# Patient Record
Sex: Female | Born: 1980 | Race: White | Hispanic: No | Marital: Married | Smoking: Former smoker
Health system: Southern US, Community
[De-identification: ages and names within clinical notes are randomized; demographics above are authoritative.]

## PROBLEM LIST (undated history)

## (undated) DIAGNOSIS — IMO0002 Reserved for concepts with insufficient information to code with codable children: Secondary | ICD-10-CM

## (undated) DIAGNOSIS — M329 Systemic lupus erythematosus, unspecified: Secondary | ICD-10-CM

## (undated) HISTORY — PX: CHOLECYSTECTOMY: SHX55

## (undated) HISTORY — PX: OOPHORECTOMY: SHX86

## (undated) HISTORY — PX: ABDOMINAL HYSTERECTOMY: SHX81

---
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
## 1998-01-16 ENCOUNTER — Encounter: Admission: RE | Admit: 1998-01-16 | Discharge: 1998-01-16 | Payer: Self-pay | Admitting: Family Medicine

## 2000-02-14 ENCOUNTER — Encounter (INDEPENDENT_AMBULATORY_CARE_PROVIDER_SITE_OTHER): Payer: Self-pay | Admitting: Specialist

## 2000-02-14 ENCOUNTER — Other Ambulatory Visit: Admission: RE | Admit: 2000-02-14 | Discharge: 2000-02-14 | Payer: Self-pay | Admitting: *Deleted

## 2000-03-31 ENCOUNTER — Emergency Department (HOSPITAL_COMMUNITY): Admission: EM | Admit: 2000-03-31 | Discharge: 2000-03-31 | Payer: Self-pay | Admitting: Emergency Medicine

## 2000-08-20 ENCOUNTER — Other Ambulatory Visit: Admission: RE | Admit: 2000-08-20 | Discharge: 2000-08-20 | Payer: Self-pay | Admitting: *Deleted

## 2000-09-14 ENCOUNTER — Other Ambulatory Visit: Admission: RE | Admit: 2000-09-14 | Discharge: 2000-09-14 | Payer: Self-pay | Admitting: *Deleted

## 2000-09-14 ENCOUNTER — Encounter (INDEPENDENT_AMBULATORY_CARE_PROVIDER_SITE_OTHER): Payer: Self-pay | Admitting: *Deleted

## 2001-07-26 ENCOUNTER — Ambulatory Visit (HOSPITAL_COMMUNITY): Admission: RE | Admit: 2001-07-26 | Discharge: 2001-07-26 | Payer: Self-pay | Admitting: *Deleted

## 2007-09-17 ENCOUNTER — Inpatient Hospital Stay (HOSPITAL_COMMUNITY): Admission: AD | Admit: 2007-09-17 | Discharge: 2007-09-17 | Payer: Self-pay | Admitting: Obstetrics & Gynecology

## 2007-09-21 ENCOUNTER — Emergency Department (HOSPITAL_COMMUNITY): Admission: EM | Admit: 2007-09-21 | Discharge: 2007-09-21 | Payer: Self-pay | Admitting: Emergency Medicine

## 2007-09-28 ENCOUNTER — Emergency Department (HOSPITAL_COMMUNITY): Admission: EM | Admit: 2007-09-28 | Discharge: 2007-09-28 | Payer: Self-pay | Admitting: Emergency Medicine

## 2007-10-17 ENCOUNTER — Emergency Department (HOSPITAL_COMMUNITY): Admission: EM | Admit: 2007-10-17 | Discharge: 2007-10-18 | Payer: Self-pay | Admitting: Emergency Medicine

## 2008-01-16 ENCOUNTER — Emergency Department (HOSPITAL_COMMUNITY): Admission: EM | Admit: 2008-01-16 | Discharge: 2008-01-16 | Payer: Self-pay | Admitting: Emergency Medicine

## 2008-01-19 ENCOUNTER — Emergency Department: Payer: Self-pay | Admitting: Emergency Medicine

## 2008-01-28 ENCOUNTER — Inpatient Hospital Stay (HOSPITAL_COMMUNITY): Admission: AD | Admit: 2008-01-28 | Discharge: 2008-01-29 | Payer: Self-pay | Admitting: Obstetrics and Gynecology

## 2008-03-06 ENCOUNTER — Inpatient Hospital Stay (HOSPITAL_COMMUNITY): Admission: AD | Admit: 2008-03-06 | Discharge: 2008-03-06 | Payer: Self-pay | Admitting: Obstetrics & Gynecology

## 2008-03-09 ENCOUNTER — Emergency Department (HOSPITAL_COMMUNITY): Admission: EM | Admit: 2008-03-09 | Discharge: 2008-03-09 | Payer: Self-pay | Admitting: Family Medicine

## 2008-05-16 ENCOUNTER — Emergency Department (HOSPITAL_COMMUNITY): Admission: EM | Admit: 2008-05-16 | Discharge: 2008-05-16 | Payer: Self-pay | Admitting: Family Medicine

## 2008-11-30 ENCOUNTER — Emergency Department (HOSPITAL_COMMUNITY): Admission: EM | Admit: 2008-11-30 | Discharge: 2008-12-01 | Payer: Self-pay | Admitting: Emergency Medicine

## 2009-02-18 ENCOUNTER — Emergency Department (HOSPITAL_COMMUNITY): Admission: EM | Admit: 2009-02-18 | Discharge: 2009-02-19 | Payer: Self-pay | Admitting: Emergency Medicine

## 2009-02-18 ENCOUNTER — Encounter (INDEPENDENT_AMBULATORY_CARE_PROVIDER_SITE_OTHER): Payer: Self-pay | Admitting: *Deleted

## 2009-03-07 ENCOUNTER — Ambulatory Visit: Payer: Self-pay | Admitting: Obstetrics and Gynecology

## 2009-03-07 ENCOUNTER — Encounter: Payer: Self-pay | Admitting: Obstetrics & Gynecology

## 2009-04-02 ENCOUNTER — Ambulatory Visit: Payer: Self-pay | Admitting: Obstetrics & Gynecology

## 2009-04-02 ENCOUNTER — Encounter: Payer: Self-pay | Admitting: Obstetrics & Gynecology

## 2009-04-02 ENCOUNTER — Inpatient Hospital Stay (HOSPITAL_COMMUNITY): Admission: RE | Admit: 2009-04-02 | Discharge: 2009-04-04 | Payer: Self-pay | Admitting: Obstetrics & Gynecology

## 2009-04-12 ENCOUNTER — Inpatient Hospital Stay (HOSPITAL_COMMUNITY): Admission: AD | Admit: 2009-04-12 | Discharge: 2009-04-12 | Payer: Self-pay | Admitting: Obstetrics and Gynecology

## 2009-04-15 ENCOUNTER — Inpatient Hospital Stay (HOSPITAL_COMMUNITY): Admission: AD | Admit: 2009-04-15 | Discharge: 2009-04-16 | Payer: Self-pay | Admitting: Obstetrics & Gynecology

## 2009-04-16 ENCOUNTER — Encounter (INDEPENDENT_AMBULATORY_CARE_PROVIDER_SITE_OTHER): Payer: Self-pay | Admitting: *Deleted

## 2009-05-01 ENCOUNTER — Encounter (INDEPENDENT_AMBULATORY_CARE_PROVIDER_SITE_OTHER): Payer: Self-pay | Admitting: *Deleted

## 2010-01-05 ENCOUNTER — Emergency Department (HOSPITAL_COMMUNITY): Admission: EM | Admit: 2010-01-05 | Discharge: 2010-01-05 | Payer: Self-pay | Admitting: Emergency Medicine

## 2010-04-12 DIAGNOSIS — F1111 Opioid abuse, in remission: Secondary | ICD-10-CM | POA: Insufficient documentation

## 2010-05-30 NOTE — Letter (Signed)
Summary: New Patient letter  [facility]  [address] [place], [place] [address]   Phone: [phone]  Fax: [phone]       05/01/2009 MRN: [id number]  [facility] [address] [place], [place]  [address]  Dear Ms. Lawana Pai,  Welcome to the Gastroenterology Division at [facility].    You are scheduled to see Dr.  Christella Hartigan on 05-22-09 at 3:30pm on the 3rd floor at [facility], [address]  We ask that you try to arrive at our office 15 minutes prior to your appointment time to allow for check-in.  We would like you to complete the enclosed self-administered evaluation form prior to your visit and bring it with you on the day of your appointment.  We will review it with you.  Also, please bring a complete list of all your medications or, if you prefer, bring the medication bottles and we will list them.  Please bring your insurance card so that we may make a copy of it.  If your insurance requires a referral to see a specialist, please bring your referral form from your primary care physician.  Co-payments are due at the time of your visit and may be paid by cash, check or credit card.     Your office visit will consist of a consult with your physician (includes a physical exam), any laboratory testing he/she may order, scheduling of any necessary diagnostic testing (e.g. x-ray, ultrasound, CT-scan), and scheduling of a procedure (e.g. Endoscopy, Colonoscopy) if required.  Please allow enough time on your schedule to allow for any/all of these possibilities.    If you cannot keep your appointment, please call [phone] to cancel or reschedule prior to your appointment date.  This allows [place] the opportunity to schedule an appointment for another patient in need of care.  If you do not cancel or reschedule by 5 p.m. the business day prior to your appointment date, you will be charged a $50.00 late cancellation/no-show fee.    Thank you for choosing  [facility] for your medical needs.  We appreciate the opportunity to care for you.  Please visit [place] at our website  to learn more about our practice.                     Sincerely,                                                             The Gastroenterology Division

## 2010-07-29 LAB — HEMOCCULT GUIAC POC 1CARD (OFFICE): Fecal Occult Bld: NEGATIVE

## 2010-07-30 LAB — CBC
Hemoglobin: 12.8 g/dL (ref 12.0–15.0)
MCHC: 33.6 g/dL (ref 30.0–36.0)
MCHC: 33.9 g/dL (ref 30.0–36.0)
MCV: 90 fL (ref 78.0–100.0)
MCV: 90.3 fL (ref 78.0–100.0)
Platelets: 160 10*3/uL (ref 150–400)
Platelets: 187 10*3/uL (ref 150–400)
RBC: 4.24 MIL/uL (ref 3.87–5.11)
RBC: 4.71 MIL/uL (ref 3.87–5.11)
RDW: 13.4 % (ref 11.5–15.5)
WBC: 8.9 10*3/uL (ref 4.0–10.5)

## 2010-07-30 LAB — TYPE AND SCREEN
ABO/RH(D): O POS
Antibody Screen: NEGATIVE

## 2010-08-01 LAB — CBC
HCT: 42.5 % (ref 36.0–46.0)
MCV: 88.9 fL (ref 78.0–100.0)
Platelets: 165 10*3/uL (ref 150–400)
RDW: 13.2 % (ref 11.5–15.5)

## 2010-08-01 LAB — URINALYSIS, ROUTINE W REFLEX MICROSCOPIC
Ketones, ur: NEGATIVE mg/dL
Nitrite: NEGATIVE
Protein, ur: NEGATIVE mg/dL
Urobilinogen, UA: 0.2 mg/dL (ref 0.0–1.0)

## 2010-08-01 LAB — COMPREHENSIVE METABOLIC PANEL
Albumin: 3.8 g/dL (ref 3.5–5.2)
BUN: 8 mg/dL (ref 6–23)
Creatinine, Ser: 0.73 mg/dL (ref 0.4–1.2)
Total Bilirubin: 1.2 mg/dL (ref 0.3–1.2)
Total Protein: 6.7 g/dL (ref 6.0–8.3)

## 2010-08-01 LAB — GC/CHLAMYDIA PROBE AMP, GENITAL
Chlamydia, DNA Probe: NEGATIVE
GC Probe Amp, Genital: NEGATIVE

## 2010-08-01 LAB — DIFFERENTIAL
Basophils Absolute: 0 10*3/uL (ref 0.0–0.1)
Lymphocytes Relative: 23 % (ref 12–46)
Monocytes Absolute: 0.6 10*3/uL (ref 0.1–1.0)
Monocytes Relative: 8 % (ref 3–12)
Neutro Abs: 5.4 10*3/uL (ref 1.7–7.7)
Neutrophils Relative %: 69 % (ref 43–77)

## 2010-08-01 LAB — WET PREP, GENITAL
WBC, Wet Prep HPF POC: NONE SEEN
Yeast Wet Prep HPF POC: NONE SEEN

## 2010-08-03 LAB — POCT I-STAT, CHEM 8
Calcium, Ion: 1.21 mmol/L (ref 1.12–1.32)
HCT: 42 % (ref 36.0–46.0)
Hemoglobin: 14.3 g/dL (ref 12.0–15.0)
Sodium: 139 mEq/L (ref 135–145)
TCO2: 23 mmol/L (ref 0–100)

## 2010-08-03 LAB — POCT PREGNANCY, URINE: Preg Test, Ur: NEGATIVE

## 2010-08-03 LAB — DIFFERENTIAL
Basophils Absolute: 0 10*3/uL (ref 0.0–0.1)
Basophils Relative: 0 % (ref 0–1)
Eosinophils Absolute: 0.1 10*3/uL (ref 0.0–0.7)
Eosinophils Relative: 1 % (ref 0–5)
Lymphocytes Relative: 24 % (ref 12–46)
Monocytes Absolute: 0.8 10*3/uL (ref 0.1–1.0)

## 2010-08-03 LAB — CBC
HCT: 40.6 % (ref 36.0–46.0)
MCHC: 34 g/dL (ref 30.0–36.0)
MCV: 87.7 fL (ref 78.0–100.0)
Platelets: 199 10*3/uL (ref 150–400)
RDW: 13.1 % (ref 11.5–15.5)

## 2010-08-12 LAB — LIPASE, BLOOD: Lipase: 22 U/L (ref 11–59)

## 2010-08-12 LAB — POCT URINALYSIS DIP (DEVICE)
Bilirubin Urine: NEGATIVE
Glucose, UA: NEGATIVE mg/dL
Specific Gravity, Urine: 1.02 (ref 1.005–1.030)
Urobilinogen, UA: 0.2 mg/dL (ref 0.0–1.0)

## 2010-08-12 LAB — CBC
Hemoglobin: 12.7 g/dL (ref 12.0–15.0)
MCHC: 33.3 g/dL (ref 30.0–36.0)
MCV: 88.6 fL (ref 78.0–100.0)
RBC: 4.32 MIL/uL (ref 3.87–5.11)
WBC: 7.6 10*3/uL (ref 4.0–10.5)

## 2010-08-12 LAB — COMPREHENSIVE METABOLIC PANEL
ALT: 57 U/L — ABNORMAL HIGH (ref 0–35)
AST: 30 U/L (ref 0–37)
CO2: 26 mEq/L (ref 19–32)
Calcium: 9.2 mg/dL (ref 8.4–10.5)
Chloride: 105 mEq/L (ref 96–112)
Creatinine, Ser: 0.74 mg/dL (ref 0.4–1.2)
GFR calc Af Amer: >60 mL/min (ref >60)
GFR calc non Af Amer: >60 mL/min (ref >60)
Glucose, Bld: 89 mg/dL (ref 70–99)
Sodium: 138 mEq/L (ref 135–145)
Total Bilirubin: 0.5 mg/dL (ref 0.3–1.2)

## 2010-08-12 LAB — DIFFERENTIAL
Basophils Absolute: 0 10*3/uL (ref 0.0–0.1)
Eosinophils Absolute: 0.1 10*3/uL (ref 0.0–0.7)
Eosinophils Relative: 1 % (ref 0–5)
Neutrophils Relative %: 50 % (ref 43–77)

## 2010-08-12 LAB — WET PREP, GENITAL: Yeast Wet Prep HPF POC: NONE SEEN

## 2010-08-12 LAB — POCT PREGNANCY, URINE: Preg Test, Ur: NEGATIVE

## 2010-08-12 LAB — RAPID URINE DRUG SCREEN, HOSP PERFORMED: Tetrahydrocannabinol: NOT DETECTED

## 2010-09-13 NOTE — H&P (Signed)
[facility]  Patient:    Kaitlyn Floyd, Kaitlyn Floyd Visit Number: [id number] MRN: [id number]          Service Type: DSU Location: [facility] Attending Physician:  Ermalene Searing Dictated by:   Marina Gravel, M.D. Admit Date:  07/26/2001                           History and Physical  DATE OF BIRTH:                [birth year]  PREOPERATIVE DIAGNOSIS:       Endometriosis and pelvic pain.  INTENDED PROCEDURE:           Diagnostic laparoscopy and CO2 laser ablation of endometriosis if identified.  HISTORY OF PRESENT ILLNESS:   A 30 year old white female, gravida 1, para 1, with a history of recurrent endometriosis and chronic pelvic pain.  She has had three previous laparoscopies, the last in May of 2002.  At that time, she was found to have endometriosis on the uterosacral ligaments bilaterally, which was ablated.  The patient had improvement in pain symptoms for about nine months, however, the pain has returned.  She has declined conservative medical treatment with Lupron Depot as she has significant concerns about the side effects.  The patient therefore presents for repeat laparoscopy and potential treatment of endometriosis if identified.  The patient describes the pain as primarily in the left lower quadrant.  It is daily and moderate to severe in intensity.  It is increased with menses and with intercourse.  She is using Motrin and birth control pills, none of which have improved the symptoms.  PAST MEDICAL HISTORY:         Endometriosis.  PAST SURGICAL HISTORY:        1. Laparoscopy x 3.                               2. D&C and hysteroscopy for endometrial polyps.  PAST OBSTETRICAL HISTORY:     Spontaneous vaginal delivery three years ago with no complications.  MEDICATIONS:                  None currently.  ALLERGIES:                    None.  SOCIAL HISTORY:               No alcohol, tobacco, or drugs.  FAMILY HISTORY:                Noncontributory.  REVIEW OF SYSTEMS:            Otherwise negative.  PHYSICAL EXAMINATION:  VITAL SIGNS:                  Blood pressure 120/60, pulse 60.  WEIGHT:                       123 pounds.  GENERAL APPEARANCE:           Alert and oriented.  In no acute distress.  SKIN:                         Warm and dry with no lesions.  NECK:  Supple with no thyromegaly.  HEART:                        Regular rate and rhythm.  LUNGS:                        Clear to auscultation.  ABDOMEN:                      Umbilical incisions and suprapubic incisions are noted.  PELVIC:                       Normal external genitalia.  Vagina and cervix normal.  No adnexal mass palpable.  Tender on the right greater than left.  A recent ultrasound performed in my office showed normal size, mid plane uterus, no adnexal masses, normal-appearing endometrial stripe, and no fluid in the cul-de-sac.  ASSESSMENT:                   Recurrent pelvic pain probably representing recurrent endometriosis.  The options have been discussed, including repeat surgery versus Lupron.  The patient declines Lupron treatment and requests repeat surgery as she has had good relief in the past.  She does intend to have future children and therefore is not ready for definitive surgical therapy.  PLAN:                         Repeat laparoscopy (open technique) and CO2 ablation of endometriosis if identified.  The operative risks were discussed, including infection, bleeding, damage to bowel, bladder, and surrounding organs.  All questions were answered.  The patient wishes to proceed. Dictated by:   Marina Gravel, M.D. Attending Physician:  Marina Gravel B DD:  07/23/01 TD:  07/23/01 Job: [id number]

## 2010-09-13 NOTE — Op Note (Signed)
[facility]  Patient:    Kaitlyn Floyd, Kaitlyn Floyd Visit Number: [id number] MRN: [id number]          Service Type: DSU Location: [facility] Attending Physician:  Ermalene Searing Dictated by:   Marina Gravel, M.D. Proc. Date: 07/26/01 Admit Date:  07/26/2001                             Operative Report  PREOPERATIVE DIAGNOSES:       1. Dysmenorrhea.                               2. Pelvic pain.                               3. History of endometriosis.  POSTOPERATIVE DIAGNOSES:      1. Dysmenorrhea.                               2. Pelvic pain.                               3. History of endometriosis.  OPERATION/PROCEDURE:          Diagnostic laparoscopy, open technique.  SURGEON:                      Marina Gravel, M.D.  ANESTHESIA:                   General.  FINDINGS:                     Examination under anesthesia - normal size uterus, retroverted position.  Normal tubes and ovaries.  No evidence of masses.  OPERATIVE FINDINGS:           Normal appearing retroverted uterus.  Normal appearing tubes and ovaries.  Normal appearing appendix.  Normal appearing anterior bladder flap.  Normal appearing posterior cul-de-sac.  Small amount of scarring of right uterosacral ligament at area previously described of endometriosis; however, no visual evidence of endometriosis.  ESTIMATED BLOOD LOSS:         Less than 50 cc.  COMPLICATIONS:                None.  INDICATIONS FOR PROCEDURE:    Patient with history of recurrent endometriosis, status post multiple laparoscopies in the past with recurrent pelvic pain. Declined trial of Lupron Depot for management of her pain, and desired operative management.  DESCRIPTION OF PROCEDURE:     The patient was taken to the operating room and general anesthesia obtained.  She was placed in the ski position and prepped and draped in standard fashion.  Examination under anesthesia revealed the above findings.  A Foley  catheter was inserted in the bladder.                                Attention was then turned to the abdomen.  A 10 mm vertical infraumbilical skin fold incision was made with a knife.  This was carried sharply to the underlying fascia.  The fascia was divided sharply with the knife and the edges elevated with Kocher clamps.  The posterior sheath and peritoneum were divided sharply with elevation of the abdominal wall.                                A pursestring suture was placed around the fascial defect with 0 Vicryl and a Hasson cannula secured.  Pneumoperitoneum was obtained with CO2 gas.                                The patient was placed in Trendelenburg position and a laparoscope inserted.  A 5 mm port was placed in the midline 2 cm above the symphysis under direct laparoscopic visualization.  The pelvis and abdomen were inspected, with the above findings noted.                                Given the lack of pathology the case was terminated.  The inferior port was removed under laparoscopic visualization. The site was hemostatic.  The scope and Hasson cannula were removed and gas released from the abdomen.  I inserted my index finger through the fascial defect and snugged down the previously placed pursestring suture.  This obliterated the fascial defect and no intra-abdominal contents herniated through the defect prior to closure.  The skin was closed with running subcuticular 4-0 Vicryl at the umbilicus and a single subcuticular 4-0 Vicryl stitch at the inferior port.                                The patient tolerated the procedure well.  There were no complications.  She was taken to the recovery room Dictated by:   Marina Gravel, M.D. Attending Physician:  Marina Gravel B DD:  07/26/01 TD:  07/26/01 Job: [id number]

## 2010-09-16 ENCOUNTER — Emergency Department (HOSPITAL_COMMUNITY)
Admission: EM | Admit: 2010-09-16 | Discharge: 2010-09-16 | Discharge status: Against Medical Advice | Payer: Self-pay | Attending: Emergency Medicine | Admitting: Emergency Medicine

## 2010-09-16 DIAGNOSIS — R109 Unspecified abdominal pain: Secondary | ICD-10-CM | POA: Insufficient documentation

## 2010-10-02 DIAGNOSIS — G4700 Insomnia, unspecified: Secondary | ICD-10-CM | POA: Insufficient documentation

## 2010-12-17 IMAGING — CT CT ABDOMEN W/ CM
1 of 2 series · 15 of 32 positions shown, 19 images · IV contrast (APPLIED)
Comparison: None.

CT ABDOMEN

CLINICAL DATA: History of cholecystectomy.  History of
hydronephrosis.  Right lower quadrant pain with nausea.

CT ABDOMEN AND PELVIS WITH CONTRAST
TECHNIQUE: Multidetector CT imaging of the abdomen and pelvis was
performed using the standard protocol following bolus
administration of intravenous contrast.
Contrast: Smnipaque-P88, 100 ml along with oral contrast

[Series 2: abd/pelv with 5.0 b31f st · axial · 0.64mm/px · z∈[+628,+1052]mm · 15 of 93 slices shown, 19 images]
[im 4/93  soft-tissue]
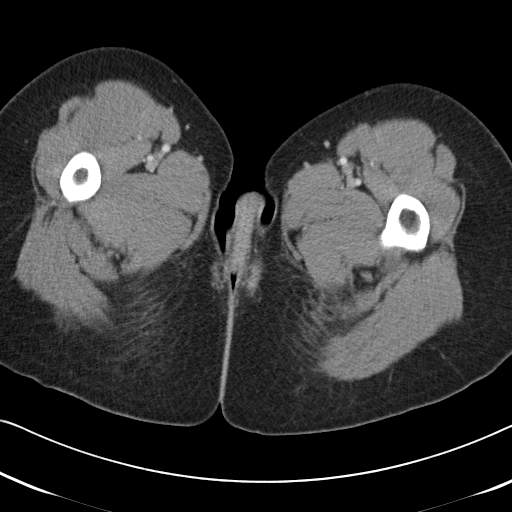
[im 4/93  bone]
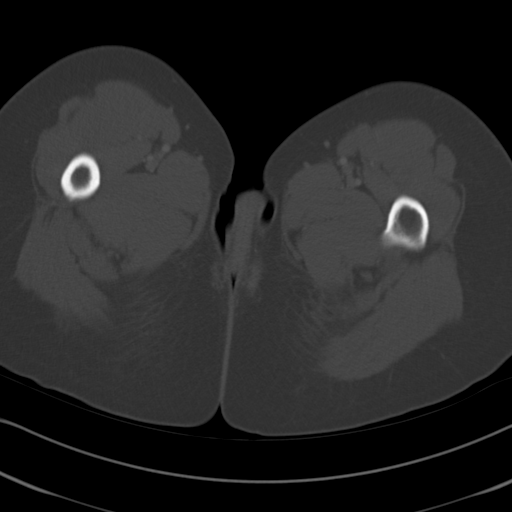
[im 12/93  soft-tissue]
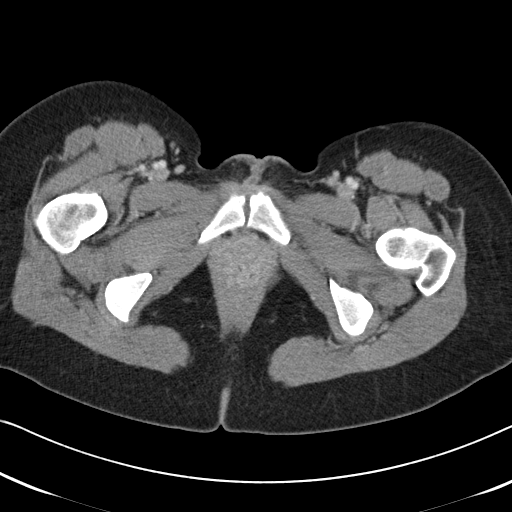
[im 20/93  soft-tissue]
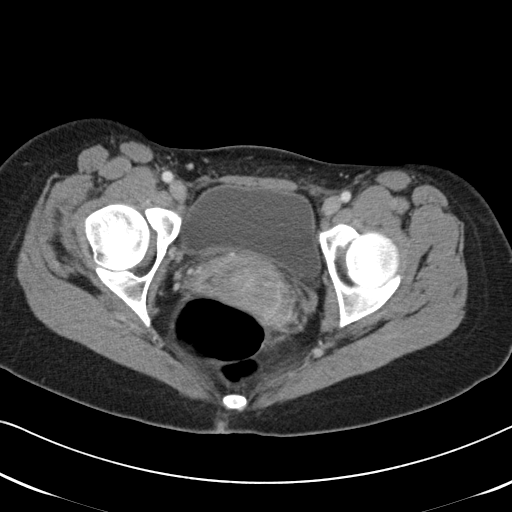
[im 27/93  soft-tissue]
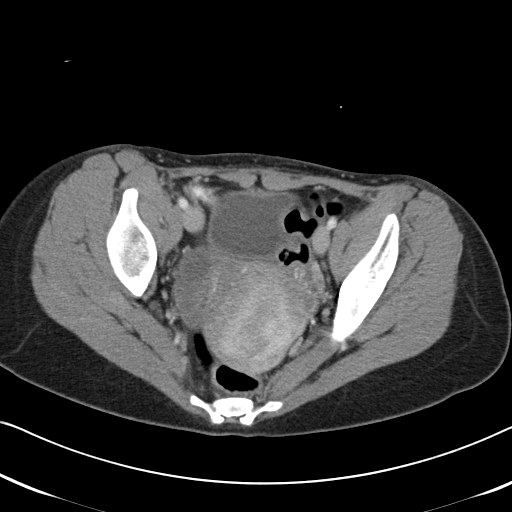
[im 31/93  soft-tissue]
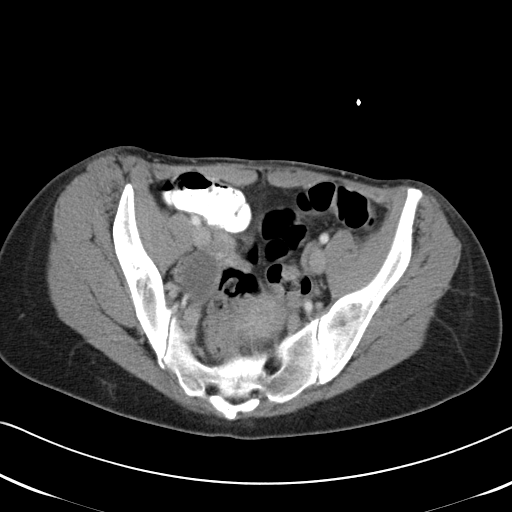
[im 39/93  soft-tissue]
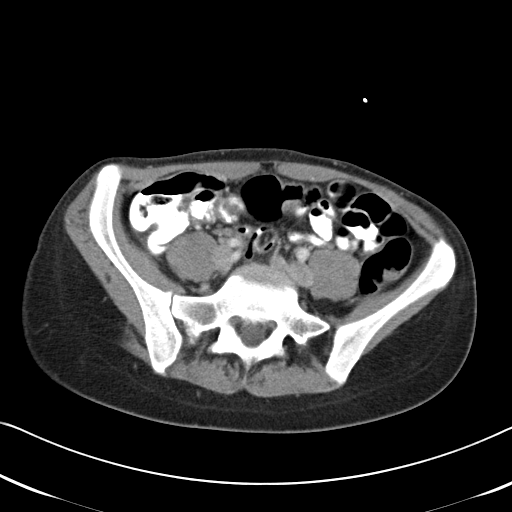
[im 47/93  soft-tissue]
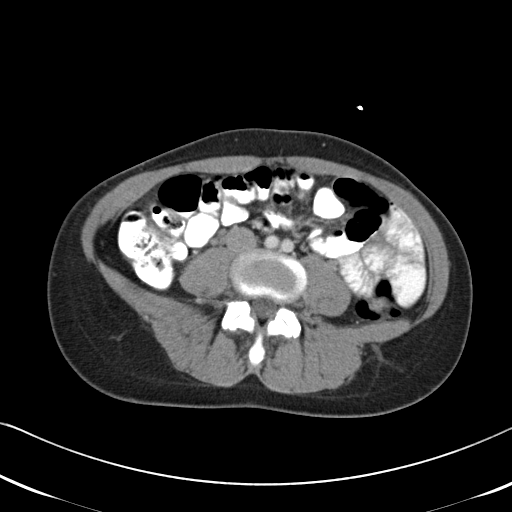
[im 54/93  soft-tissue]
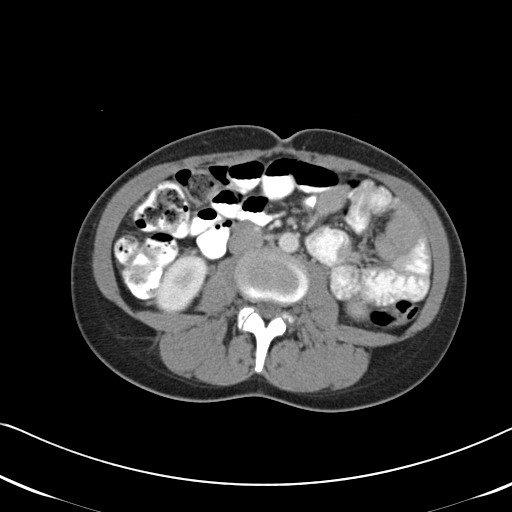
[im 62/93  soft-tissue]
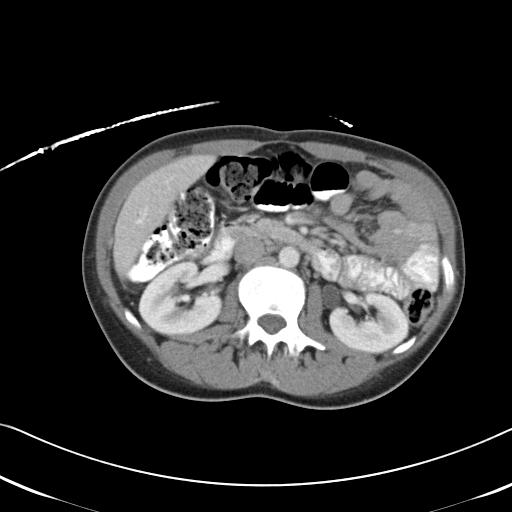
[im 62/93  bone]
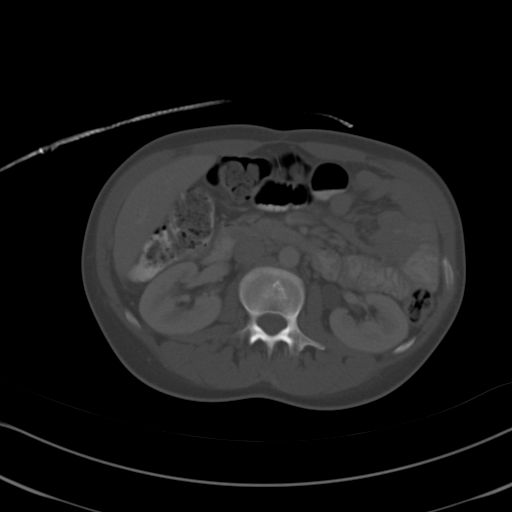
[im 66/93  soft-tissue]
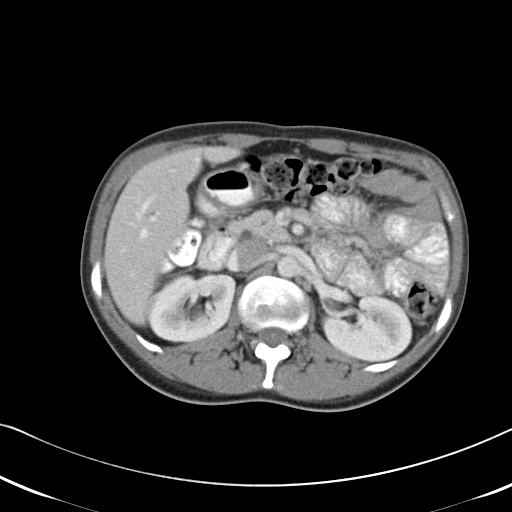
[im 73/93  soft-tissue]
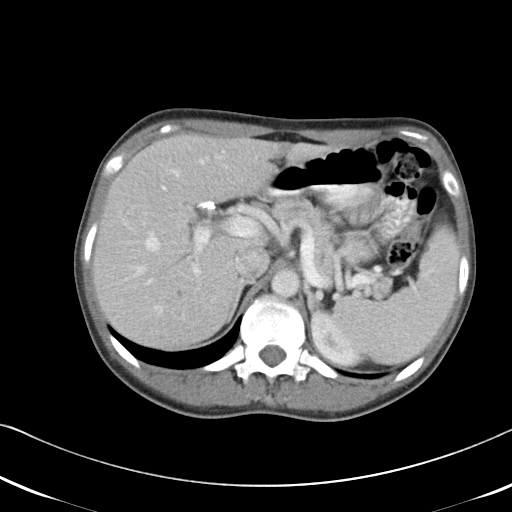
[im 77/93  lung]
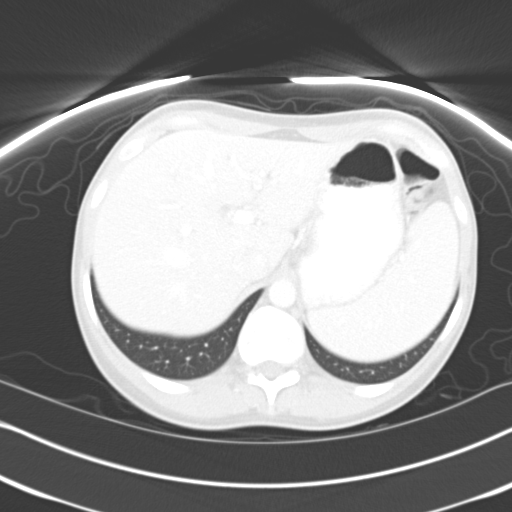
[im 81/93  soft-tissue]
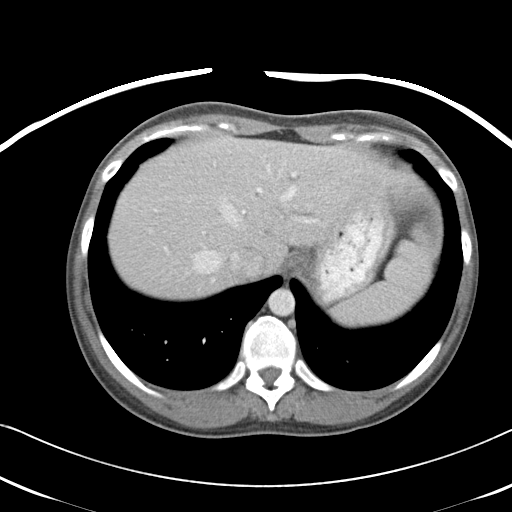
[im 81/93  lung]
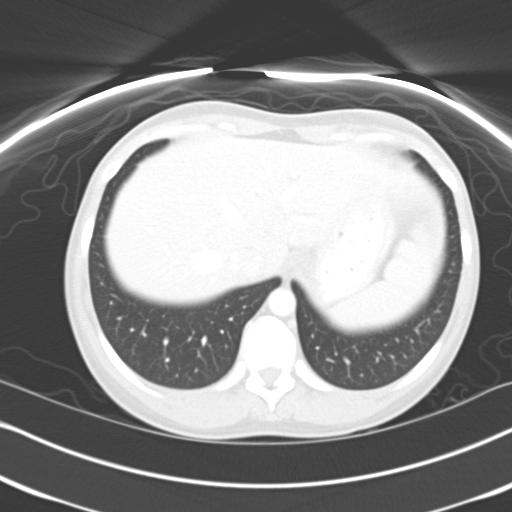
[im 85/93  lung]
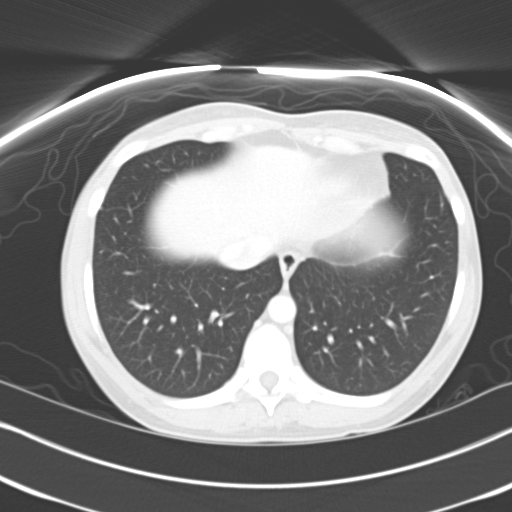
[im 89/93  soft-tissue]
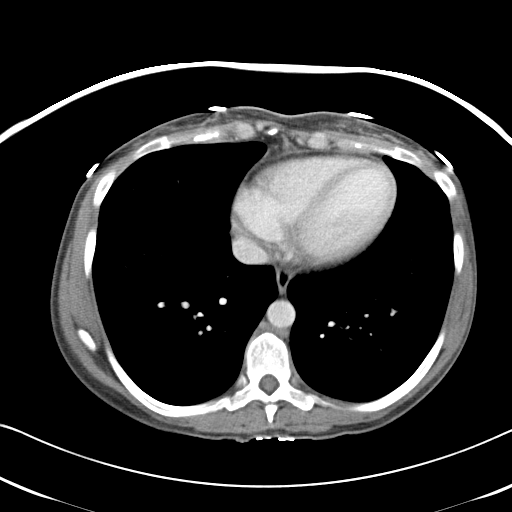
[im 89/93  lung]
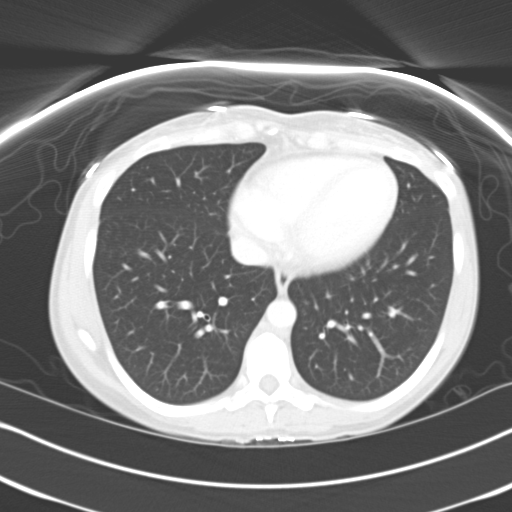

[15 of 32 positions shown; findings below may reference images not displayed]

FINDINGS: Lung bases are clear.  The liver, spleen, pancreas,
adrenal glands, and kidneys are all normal.  Gallbladder has been
removed.  There is no biliary ductal dilatation.  Visualized small
and large bowel are unremarkable.  There is no free fluid or free
air.  Retroperitoneal aorta and IVC are within normal limits.
Visualized lower thoracic and upper lumbar vertebral bodies are
unremarkable with mild lower thoracic degenerative disc disease
IMPRESSION: Status post cholecystectomy.  No acute features

CT PELVIS
FINDINGS: Appendix visualized and normal.  No bowel obstruction.
No free fluid or free air.  Uterus mildly enlarged and retroverted.

There is a large right ovarian cyst.  Cross-sectional measurements
are 35 x 43 by 28 mm.  Small calcified disc protrusion L5-S1.  No
bladder calculi.  No obstructive uropathy.
IMPRESSION: Large 35 x 43 x 20 mm right ovarian cyst.  No evidence for
appendicitis or right lower quadrant inflammatory process.  No
bowel obstruction.   No visible free fluid.

## 2010-12-17 IMAGING — US US PELVIS COMPLETE MODIFY
1 series · 13 of 25 positions shown · non-contrast
Comparison: CT scan earlier today

02/19/09 - DUPLICATE COPY for exam association in [facility] – No change from original report.
CLINICAL DATA: Right-sided pain. Evaluate for ovarian torsion.

 TRANSABDOMINAL AND TRANSVAGINAL ULTRASOUND OF PELVIS
 DOPPLER ULTRASOUND OF OVARIES
TECHNIQUE: Both transabdominal and transvaginal ultrasound
 examinations of the pelvis were performed including evaluation of
 the uterus, ovaries, adnexal regions, and pelvic cul-de-sac. Color
 and duplex Doppler ultrasound was utilized to evaluate blood flow
 to the ovaries.

[Series 1: us pelvis complete modify · 0.26mm/px · 13 of 91 slices shown]
[im 1/91]
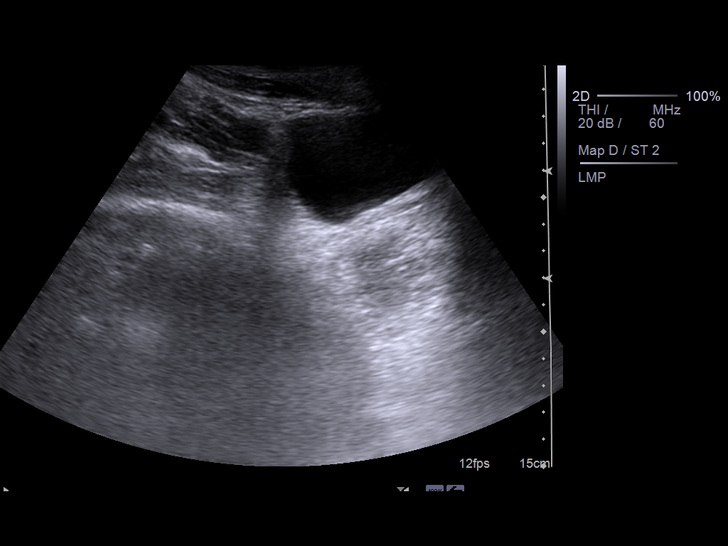
[im 8/91]
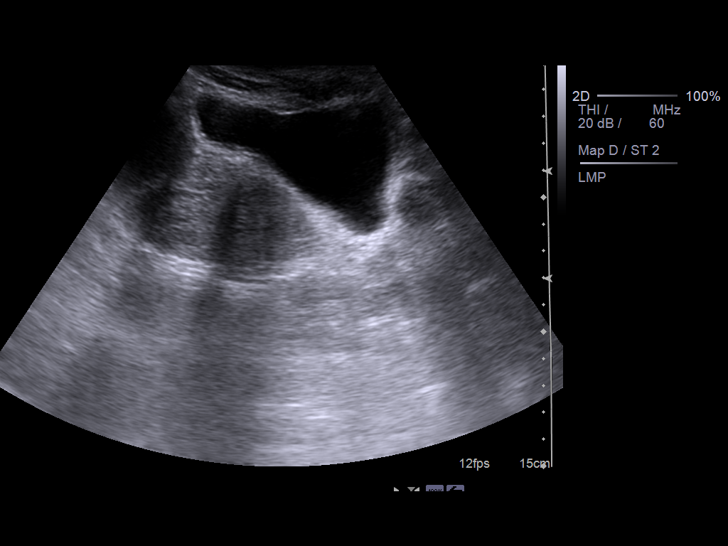
[im 16/91]
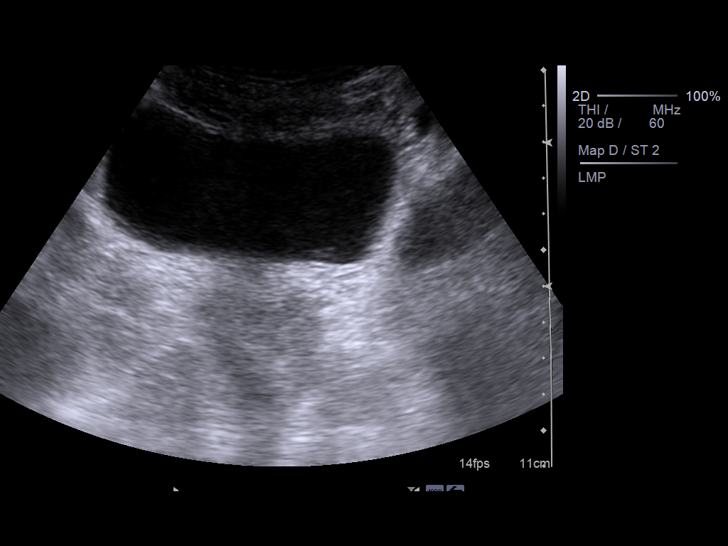
[im 23/91]
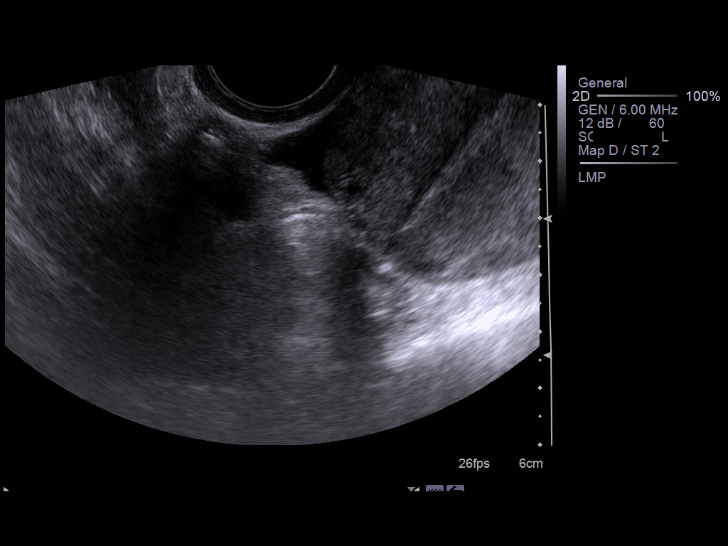
[im 31/91]
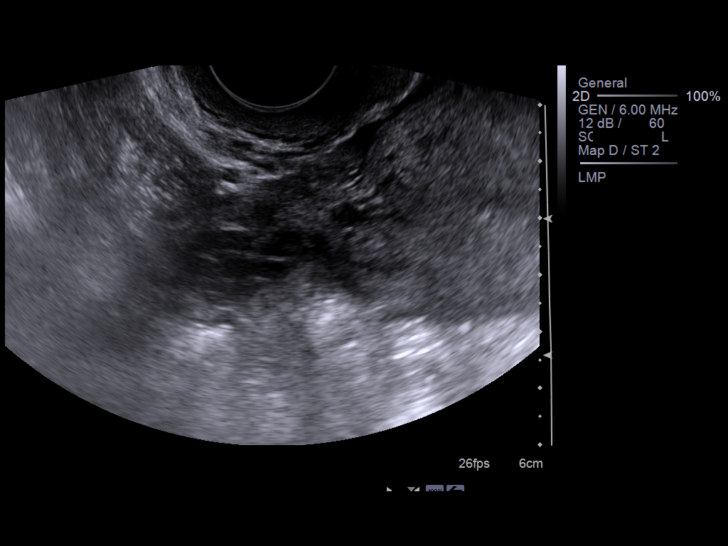
[im 38/91]
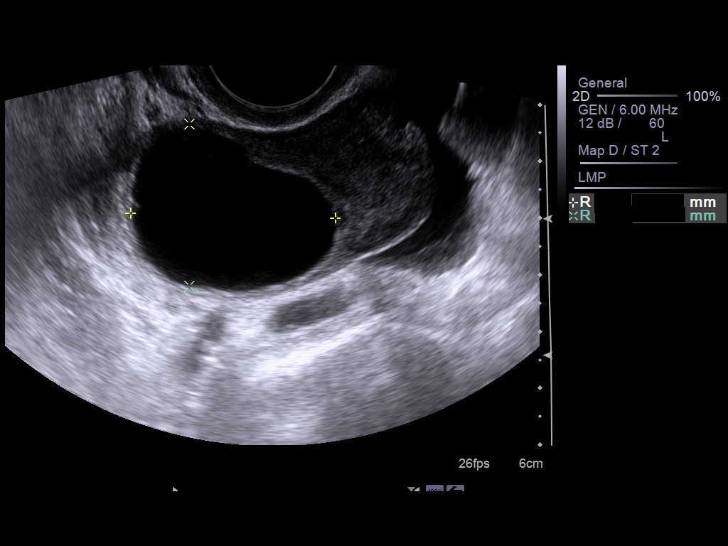
[im 46/91]
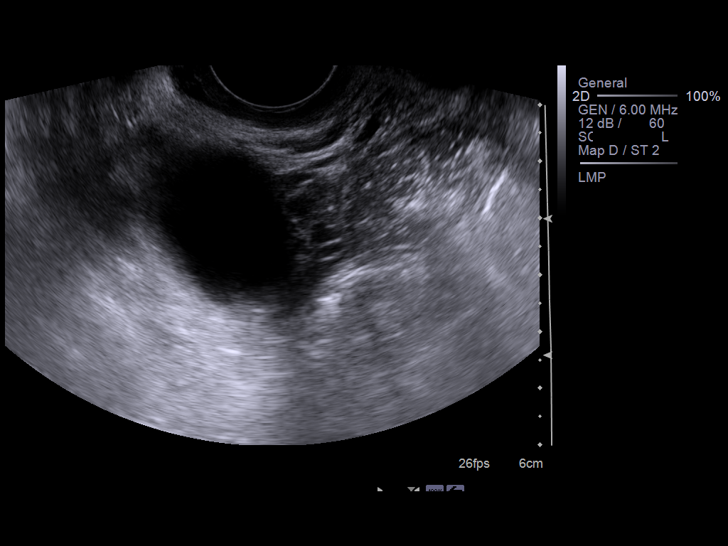
[im 53/91]
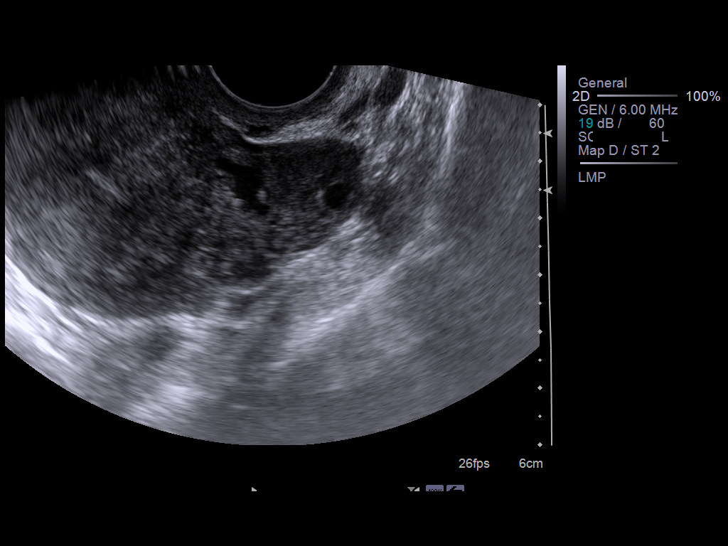
[im 61/91]
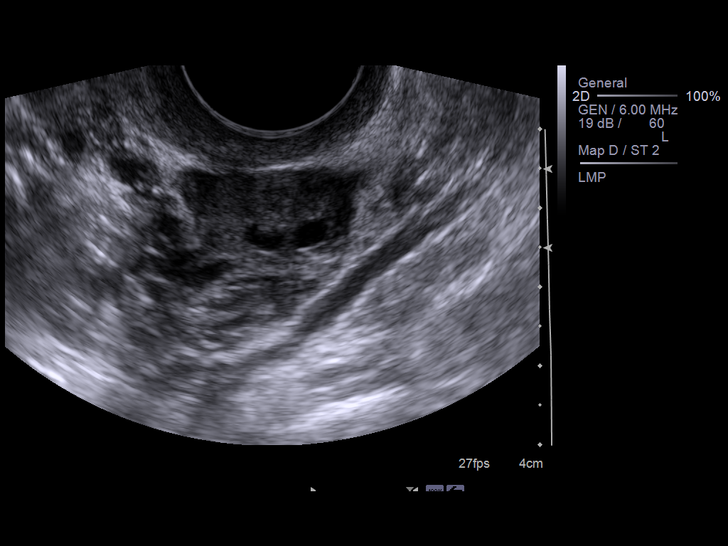
[im 68/91]
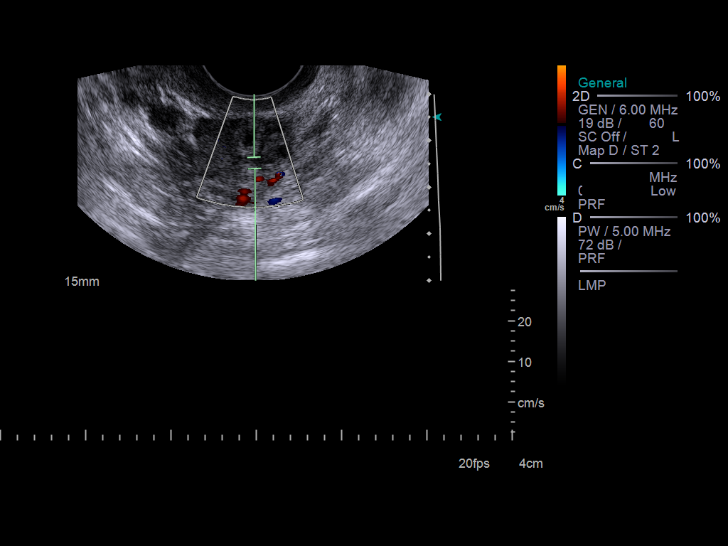
[im 76/91]
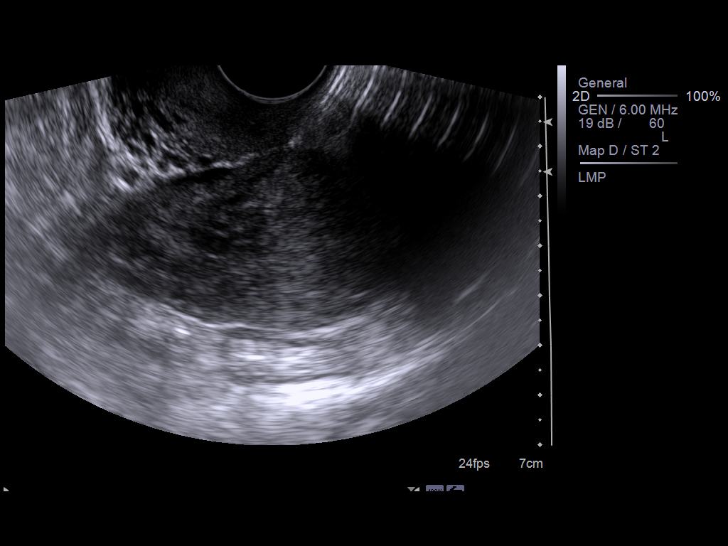
[im 83/91]
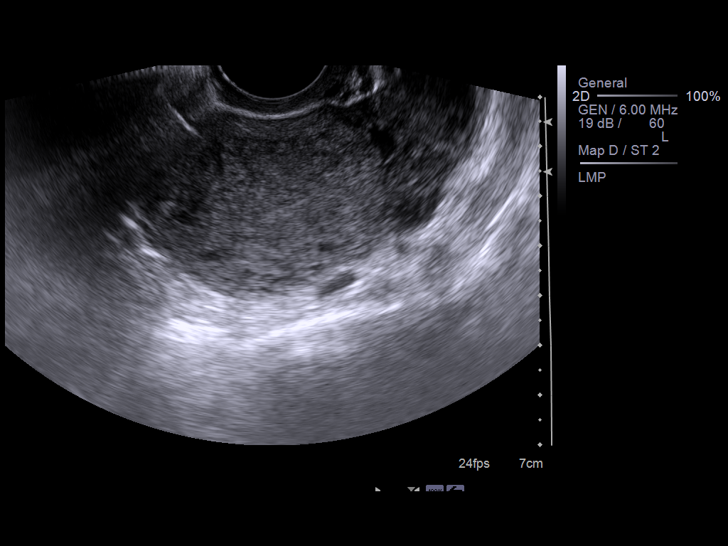
[im 91/91]
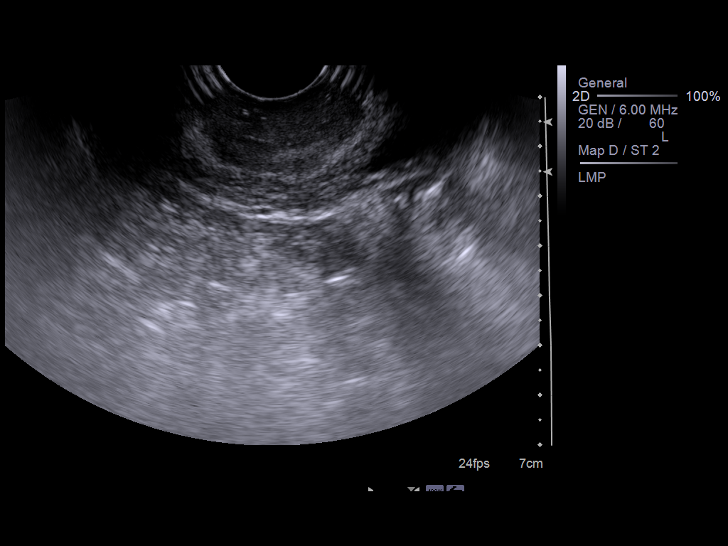

[13 of 25 positions shown; findings below may reference images not displayed]

FINDINGS: Uterus measures 10 x 5 x 6.3 cm. No fibroids or other uterine
 masses identified. Uterus retroverted.

 Endometrium measures 3 mm in thickness. Within normal limits in
 appearance.

 Right Ovary measures 5.4 x 3.6 x 3.6. Normal appearance except for
 2.3 x 1.0 x 1.2 cm cyst and 3.6 x 2.9 x 3.0 cm cyst. This
 appearance correlates with the CT findings.

 Left Ovary measures 2.3 x 1.7 x 1.8. Normal appearance. No
 significant cystic structure.

 Other Findings: No adnexal mass or free fluid identified.

 Doppler Evaluation: Color Doppler shows blood flow is present
 within both ovaries. Spectral Doppler evaluation shows normal
 arterial and venous waveform patterns in both ovaries.
IMPRESSION: 1. No evidence of pelvic mass or other acute findings.
 2. No evidence of ovarian torsion.
 3. Right-sided ovarian cysts as described, consistent with prior
 CT appearance.

## 2011-01-22 LAB — WET PREP, GENITAL
Trich, Wet Prep: NONE SEEN
Yeast Wet Prep HPF POC: NONE SEEN

## 2011-01-22 LAB — URINALYSIS, ROUTINE W REFLEX MICROSCOPIC
Glucose, UA: NEGATIVE
Hgb urine dipstick: NEGATIVE
Ketones, ur: NEGATIVE
Protein, ur: NEGATIVE
Urobilinogen, UA: 0.2

## 2011-01-22 LAB — CBC
Hemoglobin: 12.3
MCHC: 34.3
MCV: 89.5
RBC: 4.01

## 2011-01-23 LAB — URINALYSIS, ROUTINE W REFLEX MICROSCOPIC
Bilirubin Urine: NEGATIVE
Nitrite: NEGATIVE
Protein, ur: NEGATIVE
Urobilinogen, UA: 1

## 2011-01-23 LAB — DIFFERENTIAL
Basophils Absolute: 0
Basophils Relative: 0
Monocytes Absolute: 0.5
Neutro Abs: 4.6
Neutrophils Relative %: 62

## 2011-01-23 LAB — CBC
Hemoglobin: 12.9
MCHC: 33.4
RDW: 12.3

## 2011-01-23 LAB — POCT I-STAT, CHEM 8
Glucose, Bld: 85
HCT: 39
Hemoglobin: 13.3
Potassium: 3.8

## 2011-01-23 LAB — PREGNANCY, URINE: Preg Test, Ur: POSITIVE

## 2011-01-28 LAB — CBC
HCT: 41.2
Hemoglobin: 13.6
RBC: 4.5
RDW: 12.8
WBC: 7.7

## 2011-01-28 LAB — GC/CHLAMYDIA PROBE AMP, GENITAL: Chlamydia, DNA Probe: NEGATIVE

## 2011-01-28 LAB — WET PREP, GENITAL: Trich, Wet Prep: NONE SEEN

## 2011-04-25 ENCOUNTER — Emergency Department (INDEPENDENT_AMBULATORY_CARE_PROVIDER_SITE_OTHER)
Admission: EM | Admit: 2011-04-25 | Discharge: 2011-04-25 | Disposition: A | Discharge status: Home / Self Care | Payer: Self-pay | Source: Home / Self Care | Attending: Emergency Medicine | Admitting: Emergency Medicine

## 2011-04-25 ENCOUNTER — Encounter: Payer: Self-pay | Admitting: *Deleted

## 2011-04-25 DIAGNOSIS — J039 Acute tonsillitis, unspecified: Secondary | ICD-10-CM

## 2011-04-25 DIAGNOSIS — I889 Nonspecific lymphadenitis, unspecified: Secondary | ICD-10-CM

## 2011-04-25 MED ORDER — CLINDAMYCIN HCL 150 MG PO CAPS: 150.0000 mg | ORAL_CAPSULE | Freq: Three times a day (TID) | ORAL | Status: AC

## 2011-04-25 MED ORDER — AMOXICILLIN-POT CLAVULANATE 875-125 MG PO TABS: 1.0000 | ORAL_TABLET | Freq: Two times a day (BID) | ORAL | Status: AC

## 2011-04-25 MED ORDER — HYDROCODONE-ACETAMINOPHEN 7.5-500 MG/15ML PO SOLN: 15.0000 mL | Freq: Three times a day (TID) | ORAL | Status: AC | PRN

## 2011-04-25 NOTE — ED Provider Notes (Signed)
History     CSN: [id number]  Arrival date & time 04/25/11  1340   First MD Initiated Contact with Patient 04/25/11 1345      Chief Complaint  Patient presents with  . Sore Throat    (Consider location/radiation/quality/duration/timing/severity/associated sxs/prior treatment) HPI Comments: Started with a throat throat x 3 days, hurst  A lot to swallow, the pain is now also in my R ear and have enlarged lymph nodes on my R side" No SOB, no cough Have had some fevers too"  Patient is a 30 y.o. female presenting with pharyngitis. The history is provided by the patient.  Sore Throat This is a new problem. The current episode started more than 2 days ago. The problem occurs constantly. The problem has been gradually worsening. Pertinent negatives include no shortness of breath. The symptoms are aggravated by swallowing. The symptoms are relieved by NSAIDs. The treatment provided no relief.    History reviewed. No pertinent past medical history.  Past Surgical History  Procedure Date  . Abdominal hysterectomy   . Oophorectomy   . Cholecystectomy     History reviewed. No pertinent family history.  History  Substance Use Topics  . Smoking status: Not on file  . Smokeless tobacco: Not on file  . Alcohol Use: No    OB History    Grav Para Term Preterm Abortions TAB SAB Ect Mult Living                  Review of Systems  HENT: Positive for sore throat. Negative for facial swelling.   Respiratory: Negative for cough and shortness of breath.     Allergies  Review of patient's allergies indicates no known allergies.  Home Medications   Current Outpatient Rx  Name Route Sig Dispense Refill  . ESTRADIOL 0.1 MG/24HR TD PTTW Transdermal Place 1 patch onto the skin 2 (two) times a week.      . AMOXICILLIN-POT CLAVULANATE 875-125 MG PO TABS Oral Take 1 tablet by mouth every 12 (twelve) hours. X 10 days 14 tablet 0  . CLINDAMYCIN HCL 150 MG PO CAPS Oral Take 1 capsule (150 mg  total) by mouth 3 (three) times daily. 15 capsule 0  . HYDROCODONE-ACETAMINOPHEN 7.5-500 MG/15ML PO SOLN Oral Take 15 mLs by mouth every 8 (eight) hours as needed for pain. 120 mL 0    BP 102/63  Pulse 78  Temp(Src) 98 F (36.7 C) (Oral)  Resp 18  SpO2 100%  Physical Exam  ED Course  Procedures (including critical care time)   Labs Reviewed  POCT RAPID STREP A ([facility]RG CARE ONLY)  STREP A DNA PROBE   No results found.   1. Tonsillitis   2. Lymphadenitis       MDM  Tonsillitis        Jimmie Molly, MD 04/25/11 1622

## 2011-04-25 NOTE — ED Notes (Signed)
Pt  Re[ports  Symptoms  Of  sorethroat  As  Well as    Swollen tender  r  Lymph  Nodes    Pain  r  Ear  Area  As  Well  Symptoms  X   3   Days   -  Pt   Sitting upright    On  Exam  Table      Pt is  In no  Acute  Distress speaking in  Complete  sentances

## 2011-04-26 LAB — STREP A DNA PROBE: Group A Strep Probe: NEGATIVE

## 2011-06-15 ENCOUNTER — Encounter (HOSPITAL_COMMUNITY): Payer: Self-pay | Admitting: *Deleted

## 2011-06-15 ENCOUNTER — Emergency Department (HOSPITAL_COMMUNITY)
Admission: EM | Admit: 2011-06-15 | Discharge: 2011-06-15 | Disposition: A | Discharge status: Home / Self Care | Payer: Self-pay | Attending: Emergency Medicine | Admitting: Emergency Medicine

## 2011-06-15 DIAGNOSIS — M5430 Sciatica, unspecified side: Secondary | ICD-10-CM | POA: Insufficient documentation

## 2011-06-15 DIAGNOSIS — M25559 Pain in unspecified hip: Secondary | ICD-10-CM | POA: Insufficient documentation

## 2011-06-15 DIAGNOSIS — M545 Low back pain, unspecified: Secondary | ICD-10-CM | POA: Insufficient documentation

## 2011-06-15 DIAGNOSIS — IMO0001 Reserved for inherently not codable concepts without codable children: Secondary | ICD-10-CM | POA: Insufficient documentation

## 2011-06-15 DIAGNOSIS — M533 Sacrococcygeal disorders, not elsewhere classified: Secondary | ICD-10-CM | POA: Insufficient documentation

## 2011-06-15 DIAGNOSIS — M79609 Pain in unspecified limb: Secondary | ICD-10-CM | POA: Insufficient documentation

## 2011-06-15 DIAGNOSIS — M5432 Sciatica, left side: Secondary | ICD-10-CM

## 2011-06-15 MED ORDER — MELOXICAM 7.5 MG PO TABS: 7.5000 mg | ORAL_TABLET | Freq: Every day | ORAL | Status: DC

## 2011-06-15 MED ORDER — CYCLOBENZAPRINE HCL 10 MG PO TABS: 10.0000 mg | ORAL_TABLET | Freq: Two times a day (BID) | ORAL | Status: AC | PRN

## 2011-06-15 MED ORDER — PREDNISONE 50 MG PO TABS: 50.0000 mg | ORAL_TABLET | Freq: Every day | ORAL | Status: DC

## 2011-06-15 NOTE — ED Notes (Signed)
Gradual onset right hip pain (SI area) shooting down left thigh. Denies any injury. Today pain increasingly worse.

## 2011-06-15 NOTE — ED Provider Notes (Signed)
History     CSN: [id number]  Arrival date & time 06/15/11  1055   First MD Initiated Contact with Patient 06/15/11 1114      Chief Complaint  Patient presents with  . Hip Pain    left side  . Leg Pain    left side    (Consider location/radiation/quality/duration/timing/severity/associated sxs/prior treatment) Patient is a 31 y.o. female presenting with back pain. The history is provided by the patient.  Back Pain  This is a new problem. The current episode started yesterday. The problem occurs constantly. Associated symptoms include leg pain. Pertinent negatives include no fever, no numbness, no abdominal pain and no weakness.  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 States since yesterday, severe pain in left lower back radiating down left buttocks, and into left thigh. Pt states pain worse with movement. Was in patients room today at work when her leg "gave out." States some tingling in left toes, otherwise no weakness or numbness in left leg. Denies recent injury. Remote hx of horse back injury, but never evaluated. States no fever, urinary symptoms, no loss of bowels urinar incontinence/retention.   History reviewed. No pertinent past medical history.  Past Surgical History  Procedure Date  . Abdominal hysterectomy   . Oophorectomy   . Cholecystectomy     No family history on file.  History  Substance Use Topics  . Smoking status: Not on file  . Smokeless tobacco: Not on file  . Alcohol Use: No    OB History    Grav Para Term Preterm Abortions TAB SAB Ect Mult Living                  Review of Systems  Constitutional: Negative for fever and chills.  HENT: Negative.   Eyes: Negative.   Respiratory: Negative.   Cardiovascular: Negative.   Gastrointestinal: Negative.  Negative for nausea and abdominal pain.  Genitourinary: Negative.   Musculoskeletal: Positive for back pain. Negative for joint swelling.  Neurological: Negative for weakness and numbness.   Psychiatric/Behavioral: Negative.     Allergies  Review of patient's allergies indicates no known allergies.  Home Medications   Current Outpatient Rx  Name Route Sig Dispense Refill  . ESTRADIOL 0.1 MG/24HR TD PTTW Transdermal Place 1 patch onto the skin 2 (two) times a week.      . IBUPROFEN 200 MG PO TABS Oral Take 800 mg by mouth every 8 (eight) hours as needed. For pain.      BP 131/81  Pulse 116  Temp(Src) 98.7 F (37.1 C) (Oral)  Resp 18  Ht 5\' 4"  (1.626 m)  Wt 130 lb (58.968 kg)  BMI 22.31 kg/m2  SpO2 100%  Physical Exam  Nursing note and vitals reviewed. Constitutional: She is oriented to person, place, and time. She appears well-developed and well-nourished.  HENT:  Head: Normocephalic.  Neck: Neck supple.  Cardiovascular: Normal rate, regular rhythm and normal heart sounds.   Pulmonary/Chest: Effort normal and breath sounds normal.  Musculoskeletal:       Tender over midline lumbar spine, no swelling, bruising noted. Tenderness extends into left SI joint, and into left buttocks. Pain with left straight leg raise  Neurological: She is alert and oriented to person, place, and time.       Patellar reflexes 2+ and equal bilat. Equal LE stregth. Able to dorsiflex bilateral toes and feet. Normal bilateral LE sensation  Skin: Skin is warm and dry.  Psychiatric: She has a  normal mood and affect.    ED Course  Procedures (including critical care time)  Pt with sciatica type symptoms. No apparent red flags. Ambulatory. NAD. Will start on prednisone, NSAIds, muscle relaxant. Follow up with orthopedics. No diagnosis found.    MDM          Lottie Mussel, PA 06/15/11 1154

## 2011-06-15 NOTE — Discharge Instructions (Signed)
I suspect your pain is either due to sciatica or possible pinched nerve in the your back.  Take mobic as prescribed for pain. Flexeril for spasms. Take prednisone as prescribed until all gone for inflammation. Follow up with your primary care doctor or orthopedics as referred if not improving. Return if loss of bladder or bowels, unable to urinate, fever, weakness in legs, or any other concerning findings.    Sciatica Sciatica is a weakness and/or changes in sensation (tingling, jolts, hot and cold, numbness) along the path the sciatic nerve travels. Irritation or damage to lumbar nerve roots is often also referred to as lumbar radiculopathy.  Lumbar radiculopathy (Sciatica) is the most common form of this problem. Radiculopathy can occur in any of the nerves coming out of the spinal cord. The problems caused depend on which nerves are involved. The sciatic nerve is the large nerve supplying the branches of nerves going from the hip to the toes. It often causes a numbness or weakness in the skin and/or muscles that the sciatic nerve serves. It also may cause symptoms (problems) of pain, burning, tingling, or electric shock-like feelings in the path of this nerve. This usually comes from injury to the fibers that make up the sciatic nerve. Some of these symptoms are low back pain and/or unpleasant feelings in the following areas:  From the mid-buttock down the back of the leg to the back of the knee.   And/or the outside of the calf and top of the foot.   And/or behind the inner ankle to the sole of the foot.  CAUSES   Herniated or slipped disc. Discs are the little cushions between the bones in the back.   Pressure by the piriformis muscle in the buttock on the sciatic nerve (Piriformis Syndrome).   Misalignment of the bones in the lower back and buttocks (Sacroiliac Joint Derangement).   Narrowing of the spinal canal that puts pressure on or pinches the fibers that make up the sciatic nerve.    A slipped vertebra that is out of line with those above or beneath it.   Abnormality of the nervous system itself so that nerve fibers do not transmit signals properly, especially to feet and calves (neuropathy).   Tumor (this is rare).  Your caregiver can usually determine the cause of your sciatica and begin the treatment most likely to help you. TREATMENT  Taking over-the-counter painkillers, physical therapy, rest, exercise, spinal manipulation, and injections of anesthetics and/or steroids may be used. Surgery, acupuncture, and Yoga can also be effective. Mind over matter techniques, mental imagery, and changing factors such as your bed, chair, desk height, posture, and activities are other treatments that may be helpful. You and your caregiver can help determine what is best for you. With proper diagnosis, the cause of most sciatica can be identified and removed. Communication and cooperation between your caregiver and you is essential. If you are not successful immediately, do not be discouraged. With time, a proper treatment can be found that will make you comfortable. HOME CARE INSTRUCTIONS   If the pain is coming from a problem in the back, applying ice to that area for 15 to 20 minutes, 3 to 4 times per day while awake, may be helpful. Put the ice in a plastic bag. Place a towel between the bag of ice and your skin.   You may exercise or perform your usual activities if these do not aggravate your pain, or as suggested by your caregiver.  Only take over-the-counter or prescription medicines for pain, discomfort, or fever as directed by your caregiver.   If your caregiver has given you a follow-up appointment, it is very important to keep that appointment. Not keeping the appointment could result in a chronic or permanent injury, pain, and disability. If there is any problem keeping the appointment, you must call back to this facility for assistance.  SEEK IMMEDIATE MEDICAL CARE IF:    You experience loss of control of bowel or bladder.   You have increasing weakness in the trunk, buttocks, or legs.   There is numbness in any areas from the hip down to the toes.   You have difficulty walking or keeping your balance.   You have any of the above, with fever or forceful vomiting.  Document Released: 04/08/2001 Document Revised: 12/25/2010 Document Reviewed: 11/26/2007 [facility] Patient Information 2012 [place], [place].

## 2011-06-16 NOTE — ED Provider Notes (Signed)
Medical screening examination/treatment/procedure(s) were performed by non-physician practitioner and as supervising physician I was immediately available for consultation/collaboration.  Hurman Horn, MD 06/16/11 [phone]

## 2011-10-05 ENCOUNTER — Emergency Department (HOSPITAL_COMMUNITY)
Admission: EM | Admit: 2011-10-05 | Discharge: 2011-10-05 | Disposition: A | Discharge status: Home / Self Care | Payer: Self-pay | Attending: Emergency Medicine | Admitting: Emergency Medicine

## 2011-10-05 ENCOUNTER — Encounter (HOSPITAL_COMMUNITY): Payer: Self-pay | Admitting: Emergency Medicine

## 2011-10-05 DIAGNOSIS — M329 Systemic lupus erythematosus, unspecified: Secondary | ICD-10-CM | POA: Insufficient documentation

## 2011-10-05 DIAGNOSIS — Z79899 Other long term (current) drug therapy: Secondary | ICD-10-CM | POA: Insufficient documentation

## 2011-10-05 DIAGNOSIS — IMO0002 Reserved for concepts with insufficient information to code with codable children: Secondary | ICD-10-CM | POA: Insufficient documentation

## 2011-10-05 DIAGNOSIS — G43909 Migraine, unspecified, not intractable, without status migrainosus: Secondary | ICD-10-CM | POA: Insufficient documentation

## 2011-10-05 HISTORY — DX: Systemic lupus erythematosus, unspecified: M32.9

## 2011-10-05 HISTORY — DX: Reserved for concepts with insufficient information to code with codable children: IMO0002

## 2011-10-05 MED ORDER — METOCLOPRAMIDE HCL 5 MG/ML IJ SOLN
10.0000 mg | Freq: Once | INTRAMUSCULAR | Status: AC
  Administered 2011-10-05: 10 mg via INTRAVENOUS
  Filled 2011-10-05: qty 2

## 2011-10-05 MED ORDER — HYDROMORPHONE HCL PF 1 MG/ML IJ SOLN
1.0000 mg | Freq: Once | INTRAMUSCULAR | Status: AC
  Administered 2011-10-05: 1 mg via INTRAVENOUS
  Filled 2011-10-05: qty 1

## 2011-10-05 MED ORDER — KETOROLAC TROMETHAMINE 30 MG/ML IJ SOLN
30.0000 mg | Freq: Once | INTRAMUSCULAR | Status: AC
  Administered 2011-10-05: 30 mg via INTRAVENOUS
  Filled 2011-10-05: qty 1

## 2011-10-05 MED ORDER — SODIUM CHLORIDE 0.9 % IV BOLUS (SEPSIS)
1000.0000 mL | Freq: Once | INTRAVENOUS | Status: AC
  Administered 2011-10-05: 1000 mL via INTRAVENOUS

## 2011-10-05 MED ORDER — DEXAMETHASONE SODIUM PHOSPHATE 10 MG/ML IJ SOLN
10.0000 mg | Freq: Once | INTRAMUSCULAR | Status: AC
  Administered 2011-10-05: 10 mg via INTRAVENOUS
  Filled 2011-10-05: qty 1

## 2011-10-05 NOTE — ED Notes (Signed)
Pt reports that she started having a "super bad migraine that started yesterday morning" Unrelieved by prescribed medications. Pt reports photophobia, denies nausea and vomiting. Reports hx of lupus.

## 2011-10-05 NOTE — ED Provider Notes (Signed)
History     CSN: [id number]  Arrival date & time 10/05/11  0004   First MD Initiated Contact with Patient 10/05/11 0221      Chief Complaint  Patient presents with  . Migraine    The history is provided by the patient.   the patient reports severe left-sided headache for approximately 24 hours.  Her headache is worsened by it loud noise and bright light.  She reports this is similar to her typical migraines.  She denies recent trauma or falls.  She denies anticoagulant use.  She's tried medications at home without improvement center symptoms.  Her pain is moderate to severe at this time.  Nothing improves her pain.  She denies fevers or chills.  She denies weakness of her upper lower extremities  Past Medical History  Diagnosis Date  . Lupus     Past Surgical History  Procedure Date  . Abdominal hysterectomy   . Oophorectomy   . Cholecystectomy     No family history on file.  History  Substance Use Topics  . Smoking status: Not on file  . Smokeless tobacco: Not on file  . Alcohol Use: No    OB History    Grav Para Term Preterm Abortions TAB SAB Ect Mult Living                  Review of Systems  All other systems reviewed and are negative.    Allergies  Review of patient's allergies indicates no known allergies.  Home Medications   Current Outpatient Rx  Name Route Sig Dispense Refill  . SUBOXONE SL Sublingual Place 6 mg under the tongue daily.    Marland Kitchen ESTRADIOL 1 MG PO TABS Oral Take 1 mg by mouth daily.    Marland Kitchen HYDROXYCHLOROQUINE SULFATE 200 MG PO TABS Oral Take 200 mg by mouth 2 (two) times daily.    Marland Kitchen NABUMETONE 750 MG PO TABS Oral Take 750 mg by mouth 2 (two) times daily.    Marland Kitchen PREDNISONE 50 MG PO TABS Oral Take 1 tablet (50 mg total) by mouth daily. 5 tablet 0    BP 105/67  Pulse 76  Temp(Src) 98.1 F (36.7 C) (Oral)  Resp 22  Wt 130 lb (58.968 kg)  SpO2 98%  Physical Exam  Nursing note and vitals reviewed. Constitutional: She is oriented to  person, place, and time. She appears well-developed and well-nourished.  HENT:  Head: Normocephalic and atraumatic.  Eyes: Pupils are equal, round, and reactive to light.  Cardiovascular: Regular rhythm.   Pulmonary/Chest: Effort normal.  Abdominal: Soft.  Neurological: She is alert and oriented to person, place, and time.       5/5 strength in major muscle groups of  bilateral upper and lower extremities. Speech normal. No facial asymetry.     ED Course  Procedures (including critical care time)  Labs Reviewed - No data to display No results found.   1. Migraine headache       MDM  Typical migraine headache for the pt. Non focal neuro exam. No recent head trauma. No fever. Doubt meningitis. Doubt intracranial bleed. Doubt normal pressure hydrocephalus. No indication for imaging. Will treat with migraine cocktail and reevaluate  4:08 AM Patient feels much better at this time.  Discharge home        Lyanne Co, MD 10/05/11 [phone]

## 2011-12-24 DIAGNOSIS — M546 Pain in thoracic spine: Secondary | ICD-10-CM | POA: Insufficient documentation

## 2012-02-28 ENCOUNTER — Emergency Department (INDEPENDENT_AMBULATORY_CARE_PROVIDER_SITE_OTHER)
Admission: EM | Admit: 2012-02-28 | Discharge: 2012-02-28 | Disposition: A | Discharge status: Home / Self Care | Payer: Self-pay | Source: Home / Self Care | Attending: Family Medicine | Admitting: Family Medicine

## 2012-02-28 ENCOUNTER — Encounter (HOSPITAL_COMMUNITY): Payer: Self-pay | Admitting: *Deleted

## 2012-02-28 DIAGNOSIS — A389 Scarlet fever, uncomplicated: Secondary | ICD-10-CM

## 2012-02-28 DIAGNOSIS — IMO0001 Reserved for inherently not codable concepts without codable children: Secondary | ICD-10-CM

## 2012-02-28 DIAGNOSIS — J020 Streptococcal pharyngitis: Secondary | ICD-10-CM

## 2012-02-28 LAB — POCT URINALYSIS DIP (DEVICE)
Glucose, UA: NEGATIVE mg/dL
Ketones, ur: NEGATIVE mg/dL
Specific Gravity, Urine: 1.015 (ref 1.005–1.030)
Urobilinogen, UA: 0.2 mg/dL (ref 0.0–1.0)

## 2012-02-28 LAB — POCT RAPID STREP A: Streptococcus, Group A Screen (Direct): POSITIVE — AB

## 2012-02-28 MED ORDER — DICLOFENAC POTASSIUM 50 MG PO TABS: 50.0000 mg | ORAL_TABLET | Freq: Three times a day (TID) | ORAL | Status: DC

## 2012-02-28 MED ORDER — CEFDINIR 300 MG PO CAPS: 300.0000 mg | ORAL_CAPSULE | Freq: Two times a day (BID) | ORAL | Status: DC

## 2012-02-28 NOTE — ED Provider Notes (Signed)
History     CSN: [id number]  Arrival date & time 02/28/12  1744   First MD Initiated Contact with Patient 02/28/12 1752      Chief Complaint  Patient presents with  . Fever    (Consider location/radiation/quality/duration/timing/severity/associated sxs/prior treatment) Patient is a 31 y.o. female presenting with fever. The history is provided by the patient.  Fever Primary symptoms of the febrile illness include fever and myalgias. Primary symptoms do not include nausea, vomiting, diarrhea or dysuria. The current episode started today. This is a new problem. The problem has not changed since onset.   Past Medical History  Diagnosis Date  . Lupus     Past Surgical History  Procedure Date  . Abdominal hysterectomy   . Oophorectomy   . Cholecystectomy     No family history on file.  History  Substance Use Topics  . Smoking status: Not on file  . Smokeless tobacco: Not on file  . Alcohol Use: No    OB History    Grav Para Term Preterm Abortions TAB SAB Ect Mult Living                  Review of Systems  Constitutional: Positive for fever, chills and appetite change.  HENT: Positive for neck pain. Negative for congestion, rhinorrhea and postnasal drip.   Respiratory: Negative.   Gastrointestinal: Negative.  Negative for nausea, vomiting and diarrhea.  Genitourinary: Negative for dysuria, urgency, hematuria, vaginal bleeding, vaginal discharge and vaginal pain.  Musculoskeletal: Positive for myalgias.    Allergies  Review of patient's allergies indicates no known allergies.  Home Medications   Current Outpatient Rx  Name Route Sig Dispense Refill  . SUBOXONE SL Sublingual Place 6 mg under the tongue daily.    Marland Kitchen CEFDINIR 300 MG PO CAPS Oral Take 1 capsule (300 mg total) by mouth 2 (two) times daily. 20 capsule 0  . ESTRADIOL 1 MG PO TABS Oral Take 1 mg by mouth daily.    Marland Kitchen HYDROXYCHLOROQUINE SULFATE 200 MG PO TABS Oral Take 200 mg by mouth 2 (two) times  daily.    Marland Kitchen NABUMETONE 750 MG PO TABS Oral Take 750 mg by mouth 2 (two) times daily.    Marland Kitchen PREDNISONE 50 MG PO TABS Oral Take 1 tablet (50 mg total) by mouth daily. 5 tablet 0    BP 111/70  Pulse 120  Temp 103 F (39.4 C) (Oral)  Resp 22  SpO2 100%  Physical Exam  Nursing note and vitals reviewed. Constitutional: She is oriented to person, place, and time. She appears well-developed and well-nourished.  HENT:  Head: Normocephalic.  Right Ear: External ear normal.  Left Ear: External ear normal.  Mouth/Throat: Mucous membranes are normal. Oropharyngeal exudate and posterior oropharyngeal erythema present.  Eyes: Pupils are equal, round, and reactive to light.  Neck: Normal range of motion. Neck supple.  Abdominal: Soft. Bowel sounds are normal.  Lymphadenopathy:    She has cervical adenopathy.  Neurological: She is alert and oriented to person, place, and time.  Skin: Skin is warm and dry. Rash noted.       Fine sand paper rash.    ED Course  Procedures (including critical care time)  Labs Reviewed  POCT URINALYSIS DIP (DEVICE) - Abnormal; Notable for the following:    Hgb urine dipstick MODERATE (*)     Leukocytes, UA TRACE (*)  Biochemical Testing Only. Please order routine urinalysis from main lab if confirmatory testing is needed.  All other components within normal limits   No results found.   1. Strep throat/scarlet fever       MDM         Linna Hoff, MD 02/28/12 1910

## 2012-02-28 NOTE — ED Notes (Signed)
Pt  Reports  Symptoms  Of  Fever     With  Back  Pain  /  Chills          Which  Started  Today  Pt  Reports  Took  Tylenol /  Motrin  PTA    ucc

## 2012-02-28 NOTE — ED Notes (Addendum)
Pt assessed for high fever at request of registration staff. Pt is febrile and mildly tachycardic with flushed skin and <2s capillary refill. Pt reports cyanotic fingertips earlier today, but this is not observed currently. Pt states that she has taken NSAID for fever today.  Pt does not appear to be in respiratory distress, able to wait for triage in order of arrival.

## 2013-03-14 DIAGNOSIS — Z720 Tobacco use: Secondary | ICD-10-CM | POA: Insufficient documentation

## 2013-03-27 ENCOUNTER — Emergency Department (HOSPITAL_COMMUNITY): Payer: Self-pay

## 2013-03-27 ENCOUNTER — Encounter (HOSPITAL_COMMUNITY): Payer: Self-pay | Admitting: Emergency Medicine

## 2013-03-27 ENCOUNTER — Emergency Department (HOSPITAL_COMMUNITY)
Admission: EM | Admit: 2013-03-27 | Discharge: 2013-03-27 | Disposition: A | Discharge status: Home / Self Care | Payer: Self-pay | Attending: Emergency Medicine | Admitting: Emergency Medicine

## 2013-03-27 DIAGNOSIS — S8990XA Unspecified injury of unspecified lower leg, initial encounter: Secondary | ICD-10-CM | POA: Insufficient documentation

## 2013-03-27 DIAGNOSIS — M329 Systemic lupus erythematosus, unspecified: Secondary | ICD-10-CM | POA: Insufficient documentation

## 2013-03-27 DIAGNOSIS — S99921A Unspecified injury of right foot, initial encounter: Secondary | ICD-10-CM

## 2013-03-27 DIAGNOSIS — Z87891 Personal history of nicotine dependence: Secondary | ICD-10-CM | POA: Insufficient documentation

## 2013-03-27 DIAGNOSIS — Y929 Unspecified place or not applicable: Secondary | ICD-10-CM | POA: Insufficient documentation

## 2013-03-27 DIAGNOSIS — IMO0002 Reserved for concepts with insufficient information to code with codable children: Secondary | ICD-10-CM | POA: Insufficient documentation

## 2013-03-27 DIAGNOSIS — Y939 Activity, unspecified: Secondary | ICD-10-CM | POA: Insufficient documentation

## 2013-03-27 DIAGNOSIS — Z79899 Other long term (current) drug therapy: Secondary | ICD-10-CM | POA: Insufficient documentation

## 2013-03-27 NOTE — ED Provider Notes (Signed)
CSN: [id number]     Arrival date & time 03/27/13  0957 History   First MD Initiated Contact with Patient 03/27/13 1011     Chief Complaint  Patient presents with  . Foot Injury   (Consider location/radiation/quality/duration/timing/severity/associated sxs/prior Treatment) Patient is a 32 y.o. female presenting with foot injury. The history is provided by the patient and medical records. No language interpreter was used.  Foot Injury Location:  Foot (right) Time since incident:  2 hours Injury: yes   Mechanism of injury comment:  Slammed car door on her foot Foot location:  R foot Pain details:    Quality:  Aching and throbbing   Radiates to:  Does not radiate   Severity:  Severe   Onset quality:  Sudden   Timing:  Constant   Progression:  Improving Chronicity:  New Dislocation: no   Relieved by:  Acetaminophen and ice Exacerbated by: ambulation. Associated symptoms: swelling   Associated symptoms: no back pain, no decreased ROM, no fatigue, no fever, no itching, no muscle weakness, no neck pain, no numbness, no stiffness and no tingling     Past Medical History  Diagnosis Date  . Lupus    Past Surgical History  Procedure Laterality Date  . Abdominal hysterectomy    . Oophorectomy    . Cholecystectomy     History reviewed. No pertinent family history. History  Substance Use Topics  . Smoking status: Former Games developer  . Smokeless tobacco: Not on file  . Alcohol Use: No   OB History   Grav Para Term Preterm Abortions TAB SAB Ect Mult Living                 Review of Systems  Constitutional: Negative for fever and fatigue.  Musculoskeletal: Negative for back pain, neck pain and stiffness.  Skin: Negative for itching.    Allergies  Review of patient's allergies indicates no known allergies.  Home Medications   Current Outpatient Rx  Name  Route  Sig  Dispense  Refill  . Buprenorphine HCl-Naloxone HCl (SUBOXONE SL)   Sublingual   Place 6 mg under the tongue  daily.         . cefdinir (OMNICEF) 300 MG capsule   Oral   Take 1 capsule (300 mg total) by mouth 2 (two) times daily.   20 capsule   0   . diclofenac (CATAFLAM) 50 MG tablet   Oral   Take 1 tablet (50 mg total) by mouth 3 (three) times daily.   15 tablet   0   . diclofenac (CATAFLAM) 50 MG tablet   Oral   Take 1 tablet (50 mg total) by mouth 3 (three) times daily.   15 tablet   0   . estradiol (ESTRACE) 1 MG tablet   Oral   Take 1 mg by mouth daily.         . hydroxychloroquine (PLAQUENIL) 200 MG tablet   Oral   Take 200 mg by mouth 2 (two) times daily.         . nabumetone (RELAFEN) 750 MG tablet   Oral   Take 750 mg by mouth 2 (two) times daily.         . predniSONE (DELTASONE) 50 MG tablet   Oral   Take 1 tablet (50 mg total) by mouth daily.   5 tablet   0    BP 109/53  Pulse 66  Temp(Src) 97.8 F (36.6 C) (Oral)  Resp 14  SpO2 100%  Physical Exam  Musculoskeletal:  Minimal swelling, ecchymosis, or deformity.  Patient is tender to palpation in the mid portion of the metatarsals laterally.    ED Course  Procedures (including critical care time) Labs Review Labs Reviewed - No data to display Imaging Review Dg Foot Complete Right  03/27/2013   CLINICAL DATA:  Pain and swelling  EXAM: RIGHT FOOT COMPLETE - 3+ VIEW  COMPARISON:  None available  FINDINGS: There is no evidence of fracture or dislocation. There is no evidence of arthropathy or other focal bone abnormality. Soft tissues are unremarkable.  IMPRESSION: Negative.   Electronically Signed   By: Oley Balm M.D.   On: 03/27/2013 10:32    EKG Interpretation   None       MDM   1. Right foot injury, initial encounter    Patient X-Ray negative for obvious fracture or dislocation. Pain managed in ED. Pt advised to follow up with orthopedics if symptoms persist for possibility of missed fracture diagnosis. Patient given brace while in ED, conservative therapy recommended and discussed.  Patient will be dc home & is agreeable with above plan.      Arthor Captain, PA-C 03/29/13 1630

## 2013-03-27 NOTE — ED Notes (Signed)
She states she slammed her right foot in her car door this morning--c/o pain and swelling of same.

## 2013-03-27 NOTE — ED Notes (Signed)
Voiced understanding of instructions given 

## 2013-04-01 NOTE — ED Provider Notes (Signed)
Medical screening examination/treatment/procedure(s) were performed by non-physician practitioner and as supervising physician I was immediately available for consultation/collaboration.   Zandria Woldt L Braydyn Schultes, MD 04/01/13 2153 

## 2017-01-13 ENCOUNTER — Other Ambulatory Visit: Payer: Self-pay | Admitting: Obstetrics and Gynecology

## 2017-01-13 DIAGNOSIS — N6310 Unspecified lump in the right breast, unspecified quadrant: Secondary | ICD-10-CM

## 2017-01-16 ENCOUNTER — Ambulatory Visit
Admission: RE | Admit: 2017-01-16 | Discharge: 2017-01-16 | Disposition: A | Discharge status: Home / Self Care | Payer: Self-pay | Source: Ambulatory Visit | Attending: Obstetrics and Gynecology | Admitting: Obstetrics and Gynecology

## 2017-01-16 DIAGNOSIS — N6310 Unspecified lump in the right breast, unspecified quadrant: Secondary | ICD-10-CM

## 2017-01-19 ENCOUNTER — Other Ambulatory Visit: Payer: Self-pay | Admitting: Obstetrics and Gynecology

## 2017-04-03 DIAGNOSIS — B009 Herpesviral infection, unspecified: Secondary | ICD-10-CM | POA: Insufficient documentation

## 2017-05-12 ENCOUNTER — Ambulatory Visit (INDEPENDENT_AMBULATORY_CARE_PROVIDER_SITE_OTHER): Payer: 59 | Admitting: Podiatry

## 2017-05-12 ENCOUNTER — Encounter: Payer: Self-pay | Admitting: Podiatry

## 2017-05-12 ENCOUNTER — Ambulatory Visit (INDEPENDENT_AMBULATORY_CARE_PROVIDER_SITE_OTHER): Payer: 59

## 2017-05-12 VITALS — BP 108/65 | HR 63 | Ht 64.0 in | Wt 140.0 lb

## 2017-05-12 DIAGNOSIS — M21619 Bunion of unspecified foot: Secondary | ICD-10-CM

## 2017-05-12 DIAGNOSIS — M21611 Bunion of right foot: Secondary | ICD-10-CM | POA: Diagnosis not present

## 2017-05-12 DIAGNOSIS — M659 Synovitis and tenosynovitis, unspecified: Secondary | ICD-10-CM

## 2017-05-12 DIAGNOSIS — M779 Enthesopathy, unspecified: Secondary | ICD-10-CM

## 2017-05-12 DIAGNOSIS — M2011 Hallux valgus (acquired), right foot: Secondary | ICD-10-CM | POA: Diagnosis not present

## 2017-05-12 DIAGNOSIS — M79671 Pain in right foot: Secondary | ICD-10-CM

## 2017-05-12 MED ORDER — MELOXICAM 15 MG PO TABS: 15.0000 mg | ORAL_TABLET | Freq: Every day | ORAL | 0 refills | Status: DC

## 2017-05-22 ENCOUNTER — Other Ambulatory Visit: Payer: Self-pay | Admitting: Podiatry

## 2017-05-22 DIAGNOSIS — M21619 Bunion of unspecified foot: Secondary | ICD-10-CM

## 2017-05-22 DIAGNOSIS — M79671 Pain in right foot: Secondary | ICD-10-CM

## 2017-05-28 NOTE — Progress Notes (Signed)
Subjective:  Patient ID: Kaitlyn MustyHeather D Floyd, female    DOB: 1981/04/23,  MRN: [id number]  Chief Complaint  Patient presents with  . Foot Pain    right foot pain possible bunion    37 y.o. female presents with the above complaint.  Reports pain in the right foot states that she think she has a bunion.  States that the longer she is on denies nausea vomiting fever chills denies other pedal issues.  Past Medical History:  Diagnosis Date  . Lupus    Past Surgical History:  Procedure Laterality Date  . ABDOMINAL HYSTERECTOMY    . CHOLECYSTECTOMY    . OOPHORECTOMY      Current Outpatient Medications:  .  albuterol (PROVENTIL) (2.5 MG/3ML) 0.083% nebulizer solution, albuterol sulfate 2.5 mg/3 mL (0.083 %) solution for nebulization, Disp: , Rfl:  .  albuterol (VENTOLIN HFA) 108 (90 Base) MCG/ACT inhaler, Inhale into the lungs., Disp: , Rfl:  .  cloNIDine (CATAPRES) 0.1 MG tablet, clonidine HCl 0.1 mg tablet, Disp: , Rfl:  .  estradiol (VIVELLE-DOT) 0.025 MG/24HR, APPLY 1 PATCH TRANSDERMALLY TWICE A WEEK, Disp: , Rfl: 11 .  levocetirizine (XYZAL) 5 MG tablet, levocetirizine 5 mg tablet, Disp: , Rfl:  .  montelukast (SINGULAIR) 10 MG tablet, montelukast 10 mg tablet, Disp: , Rfl:  .  zolpidem (AMBIEN CR) 12.5 MG CR tablet, zolpidem ER 12.5 mg tablet,extended release,multiphase, Disp: , Rfl:  .  Buprenorphine HCl-Naloxone HCl (SUBOXONE SL), Place 6 mg under the tongue daily., Disp: , Rfl:  .  cefdinir (OMNICEF) 300 MG capsule, Take 1 capsule (300 mg total) by mouth 2 (two) times daily. (Patient not taking: Reported on 05/12/2017), Disp: 20 capsule, Rfl: 0 .  diclofenac (CATAFLAM) 50 MG tablet, Take 1 tablet (50 mg total) by mouth 3 (three) times daily. (Patient not taking: Reported on 05/12/2017), Disp: 15 tablet, Rfl: 0 .  diclofenac (CATAFLAM) 50 MG tablet, Take 1 tablet (50 mg total) by mouth 3 (three) times daily. (Patient not taking: Reported on 05/12/2017), Disp: 15 tablet, Rfl: 0 .  estradiol  (ESTRACE) 1 MG tablet, Take 1 mg by mouth daily., Disp: , Rfl:  .  hydroxychloroquine (PLAQUENIL) 200 MG tablet, Take 200 mg by mouth 2 (two) times daily., Disp: , Rfl:  .  meloxicam (MOBIC) 15 MG tablet, Take 1 tablet (15 mg total) by mouth daily., Disp: 30 tablet, Rfl: 0 .  nabumetone (RELAFEN) 750 MG tablet, Take 750 mg by mouth 2 (two) times daily., Disp: , Rfl:  .  predniSONE (DELTASONE) 50 MG tablet, Take 1 tablet (50 mg total) by mouth daily. (Patient not taking: Reported on 05/12/2017), Disp: 5 tablet, Rfl: 0  No Known Allergies Review of Systems all systems reviewed and negative except as noted in HPI Objective:   Vitals:   05/12/17 1330  BP: 108/65  Pulse: 63   General AA&O x3. Normal mood and affect.  Vascular Dorsalis pedis and posterior tibial pulses  present 2+ bilaterally  Capillary refill normal to all digits. Pedal hair growth normal.  Neurologic Epicritic sensation grossly present.  Dermatologic No open lesions. Interspaces clear of maceration. Nails well groomed and normal in appearance.  Orthopedic: MMT 5/5 in dorsiflexion, plantarflexion, inversion, and eversion. Normal joint ROM without pain or crepitus. Right foot HAV deformity   Assessment & Plan:  Patient was evaluated and treated and all questions answered.  HAV deformity with bunion right foot -Radiograph taken reviewed: HAV deformity possible cystic changes to the metatarsal head.  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  Patient wishes to proceed with conservative therapy at this time.  Padding dispensed.  Educated on proper shoe gear.  Return in about 4 weeks (around 06/09/2017) for Bunion f/u.

## 2017-06-15 ENCOUNTER — Ambulatory Visit: Payer: 59 | Admitting: Podiatry

## 2018-10-26 ENCOUNTER — Other Ambulatory Visit: Payer: Self-pay | Admitting: Obstetrics and Gynecology

## 2018-10-26 DIAGNOSIS — R5381 Other malaise: Secondary | ICD-10-CM

## 2018-10-28 ENCOUNTER — Other Ambulatory Visit: Payer: Self-pay | Admitting: Obstetrics and Gynecology

## 2018-10-28 DIAGNOSIS — N63 Unspecified lump in unspecified breast: Secondary | ICD-10-CM

## 2018-11-04 ENCOUNTER — Ambulatory Visit
Admission: RE | Admit: 2018-11-04 | Discharge: 2018-11-04 | Disposition: A | Discharge status: Home / Self Care | Payer: Managed Care, Other (non HMO) | Source: Ambulatory Visit | Attending: Obstetrics and Gynecology | Admitting: Obstetrics and Gynecology

## 2018-11-04 ENCOUNTER — Other Ambulatory Visit: Payer: Self-pay

## 2018-11-04 DIAGNOSIS — N63 Unspecified lump in unspecified breast: Secondary | ICD-10-CM

## 2018-11-11 ENCOUNTER — Other Ambulatory Visit: Payer: Self-pay | Admitting: Obstetrics and Gynecology

## 2018-11-11 DIAGNOSIS — R922 Inconclusive mammogram: Secondary | ICD-10-CM

## 2018-11-26 ENCOUNTER — Other Ambulatory Visit: Payer: Managed Care, Other (non HMO)

## 2019-01-13 ENCOUNTER — Ambulatory Visit
Admission: RE | Admit: 2019-01-13 | Discharge: 2019-01-13 | Disposition: A | Discharge status: Home / Self Care | Payer: Managed Care, Other (non HMO) | Source: Ambulatory Visit | Attending: Obstetrics and Gynecology | Admitting: Obstetrics and Gynecology

## 2019-01-13 DIAGNOSIS — R922 Inconclusive mammogram: Secondary | ICD-10-CM

## 2019-01-13 MED ORDER — GADOBUTROL 1 MMOL/ML IV SOLN
7.0000 mL | Freq: Once | INTRAVENOUS | Status: AC | PRN
  Administered 2019-01-13: 7 mL via INTRAVENOUS

## 2019-09-07 ENCOUNTER — Other Ambulatory Visit: Payer: Self-pay | Admitting: Pulmonary Disease

## 2019-12-05 ENCOUNTER — Ambulatory Visit: Payer: 59 | Admitting: Podiatry

## 2019-12-05 ENCOUNTER — Ambulatory Visit (INDEPENDENT_AMBULATORY_CARE_PROVIDER_SITE_OTHER): Payer: 59 | Admitting: Podiatry

## 2019-12-05 ENCOUNTER — Ambulatory Visit (INDEPENDENT_AMBULATORY_CARE_PROVIDER_SITE_OTHER): Payer: 59

## 2019-12-05 ENCOUNTER — Other Ambulatory Visit: Payer: Self-pay

## 2019-12-05 DIAGNOSIS — S9032XA Contusion of left foot, initial encounter: Secondary | ICD-10-CM

## 2019-12-05 DIAGNOSIS — R600 Localized edema: Secondary | ICD-10-CM | POA: Diagnosis not present

## 2019-12-05 DIAGNOSIS — S99922A Unspecified injury of left foot, initial encounter: Secondary | ICD-10-CM

## 2019-12-05 DIAGNOSIS — M7752 Other enthesopathy of left foot: Secondary | ICD-10-CM | POA: Diagnosis not present

## 2019-12-05 DIAGNOSIS — M79672 Pain in left foot: Secondary | ICD-10-CM

## 2019-12-05 MED ORDER — HYDROCODONE-ACETAMINOPHEN 5-325 MG PO TABS: 1.0000 | ORAL_TABLET | Freq: Four times a day (QID) | ORAL | 0 refills | Status: DC | PRN

## 2019-12-05 NOTE — Progress Notes (Signed)
  Subjective:  Patient ID: Kaitlyn Floyd, female    DOB: 1980/09/28,  MRN: [id number]  CC: Reports left foot injury doing cartwheel happened on Thursday.  States that she landed on her toes.  For 6-10 sharp throbbing pain went to urgent care had x-rays and was diagnosed with a left fourth metatarsal fracture.  Was given Percocet.  States the area was swollen but it is better sore most when flexing the toes cannot apply weight on the heel.  Has been using ibuprofen and crutches without relief.  Using Ace wrap for swelling.  39 y.o. female presents with the above complaint. History confirmed with patient. left  Objective:  Physical Exam: warm, good capillary refill, no trophic changes or ulcerative lesions, normal DP and PT pulses and normal sensory exam. Left Foot: dorsal foot resolving contusion, POP 3rd/4th MPJ but with maximal tenderness 4th MPJ, pain with extension. No pain met bases. No pain at the calcaneus   No images are attached to the encounter.  Radiographs: X-ray of the left foot: no definite fracture, dislocation, swelling or degenerative changes noted Assessment:   1. Contusion of left foot, initial encounter   2. Injury of left foot including toes, initial encounter   3. Capsulitis of metatarsophalangeal (MTP) joint of left foot   4. Localized edema     Plan:  Patient was evaluated and treated and all questions answered.  ?4th Met Fx Left -XR Reviewed with patient,  -Would benefit from trial of non-operative management for this injury.,  -Soft cast applied,  -WBAT in CAM Boot,  -CAM boot dispensed and   Procedure: Soft Cast Application with Unna Boot Rationale: above injury Technique: Unna boot, cast padding, Coban compression dressing applied Disposition: Patient tolerated procedure well. NV status intact post-application.    Return in about 2 weeks (around 12/19/2019) for Fracture f/u with XRs .

## 2019-12-22 ENCOUNTER — Other Ambulatory Visit: Payer: Self-pay | Admitting: Podiatry

## 2019-12-22 ENCOUNTER — Other Ambulatory Visit: Payer: Self-pay

## 2019-12-22 ENCOUNTER — Encounter: Payer: Self-pay | Admitting: Podiatry

## 2019-12-22 ENCOUNTER — Ambulatory Visit (INDEPENDENT_AMBULATORY_CARE_PROVIDER_SITE_OTHER): Payer: 59 | Admitting: Podiatry

## 2019-12-22 ENCOUNTER — Ambulatory Visit (INDEPENDENT_AMBULATORY_CARE_PROVIDER_SITE_OTHER): Payer: 59

## 2019-12-22 ENCOUNTER — Ambulatory Visit: Payer: 59 | Admitting: Podiatry

## 2019-12-22 DIAGNOSIS — S9032XA Contusion of left foot, initial encounter: Secondary | ICD-10-CM

## 2019-12-22 DIAGNOSIS — S92302D Fracture of unspecified metatarsal bone(s), left foot, subsequent encounter for fracture with routine healing: Secondary | ICD-10-CM

## 2019-12-22 DIAGNOSIS — E559 Vitamin D deficiency, unspecified: Secondary | ICD-10-CM

## 2019-12-22 DIAGNOSIS — S9032XS Contusion of left foot, sequela: Secondary | ICD-10-CM | POA: Diagnosis not present

## 2019-12-23 ENCOUNTER — Encounter: Payer: Self-pay | Admitting: Podiatry

## 2019-12-23 LAB — VITAMIN D 25 HYDROXY (VIT D DEFICIENCY, FRACTURES): Vit D, 25-Hydroxy: 31.5 ng/mL (ref 30.0–100.0)

## 2019-12-25 MED ORDER — VITAMIN D (ERGOCALCIFEROL) 1.25 MG (50000 UNIT) PO CAPS: 50000.0000 [IU] | ORAL_CAPSULE | ORAL | 1 refills | Status: DC

## 2019-12-26 ENCOUNTER — Other Ambulatory Visit: Payer: Self-pay | Admitting: Podiatry

## 2019-12-26 DIAGNOSIS — S9032XS Contusion of left foot, sequela: Secondary | ICD-10-CM

## 2019-12-26 DIAGNOSIS — S9032XA Contusion of left foot, initial encounter: Secondary | ICD-10-CM

## 2019-12-29 ENCOUNTER — Encounter: Payer: Self-pay | Admitting: Podiatry

## 2019-12-29 ENCOUNTER — Other Ambulatory Visit (INDEPENDENT_AMBULATORY_CARE_PROVIDER_SITE_OTHER): Payer: 59 | Admitting: Podiatry

## 2019-12-29 MED ORDER — HYDROCODONE-ACETAMINOPHEN 5-325 MG PO TABS: 1.0000 | ORAL_TABLET | Freq: Four times a day (QID) | ORAL | 0 refills | Status: DC | PRN

## 2019-12-29 NOTE — Progress Notes (Signed)
  Subjective:  Patient ID: Kaitlyn Floyd, female    DOB: Oct 18, 1980,  MRN: [id number]   CC: Patient report reports occasional throbbing in the evenings and swelling but otherwise states that her pain is doing a lot better.  States that she can't apply pressure to the outside of her foot.  39 y.o. female presents with the above complaint. History confirmed with patient.  Objective:  Physical Exam: warm, good capillary refill, no trophic changes or ulcerative lesions, normal DP and PT pulses and normal sensory exam. Left Foot: continued POP left 4th MPJ  No images are attached to the encounter.  Radiographs: X-ray of the left foot: 4th metatarsal fracture noted Assessment:   1. Vitamin D deficiency   2. Closed fracture of neck of metatarsal bone of left foot with routine healing, subsequent encounter     Plan:  Patient was evaluated and treated and all questions answered.  4th Met Fx Left -XR Reviewed with patient -Continue non-operative management -WBAT in CAM Boot -Check Vit D    Return in about 4 weeks (around 01/19/2020).

## 2019-12-29 NOTE — Telephone Encounter (Signed)
Patient LVM stating she has gone back to work and doing well. She does have pain more at night after being on her feet at work. Pain not relieved with ibuprofen or tylenol. Would like a refill on pain meds. Pharmacy is [facility] in [place]. Patient call back number is [phone]. Dr. Samuella Cota approved refill of pain medication. Rx sent.

## 2020-01-09 ENCOUNTER — Encounter: Payer: Self-pay | Admitting: Podiatry

## 2020-01-15 ENCOUNTER — Other Ambulatory Visit: Payer: Self-pay | Admitting: Podiatry

## 2020-01-15 NOTE — Telephone Encounter (Signed)
Please advise 

## 2020-01-19 ENCOUNTER — Ambulatory Visit (INDEPENDENT_AMBULATORY_CARE_PROVIDER_SITE_OTHER): Payer: 59 | Admitting: Podiatry

## 2020-01-19 DIAGNOSIS — Z5329 Procedure and treatment not carried out because of patient's decision for other reasons: Secondary | ICD-10-CM

## 2020-01-19 NOTE — Progress Notes (Signed)
No show for appt. 

## 2020-01-31 ENCOUNTER — Ambulatory Visit (INDEPENDENT_AMBULATORY_CARE_PROVIDER_SITE_OTHER): Payer: 59 | Admitting: Podiatry

## 2020-01-31 DIAGNOSIS — Z5329 Procedure and treatment not carried out because of patient's decision for other reasons: Secondary | ICD-10-CM

## 2020-01-31 NOTE — Progress Notes (Signed)
   Complete physical exam  Patient: Kaitlyn Floyd   DOB: 02/15/2001   39 y.o. Female  MRN: [id number]  Subjective:    No chief complaint on file.   Kaitlyn Floyd is a 39 y.o. female who presents today for a complete physical exam. She reports consuming a {diet types:17450} diet. {types:19826} She generally feels {DESC; WELL/FAIRLY WELL/POORLY:18703}. She reports sleeping {DESC; WELL/FAIRLY WELL/POORLY:18703}. She {does/does not:200015} have additional problems to discuss today.    Most recent fall risk assessment:    10/23/2021   10:42 AM  Fall Risk   Falls in the past year? 0  Number falls in past yr: 0  Injury with Fall? 0  Risk for fall due to : No Fall Risks  Follow up Falls evaluation completed     Most recent depression screenings:    10/23/2021   10:42 AM 09/13/2020   10:46 AM  PHQ 2/9 Scores  PHQ - 2 Score 0 0  PHQ- 9 Score 5     {VISON DENTAL STD PSA (Optional):27386}  {History (Optional):23778}  Patient Care Team: Jessup, Joy, NP as PCP - General (Nurse Practitioner)   Outpatient Medications Prior to Visit  Medication Sig   fluticasone (FLONASE) 50 MCG/ACT nasal spray Place 2 sprays into both nostrils in the morning and at bedtime. After 7 days, reduce to once daily.   norgestimate-ethinyl estradiol (SPRINTEC 28) 0.25-35 MG-MCG tablet Take 1 tablet by mouth daily.   Nystatin POWD Apply liberally to affected area 2 times per day   spironolactone (ALDACTONE) 100 MG tablet Take 1 tablet (100 mg total) by mouth daily.   No facility-administered medications prior to visit.    ROS        Objective:     There were no vitals taken for this visit. {Vitals History (Optional):23777}  Physical Exam   No results found for any visits on 11/28/21. {Show previous labs (optional):23779}    Assessment & Plan:    Routine Health Maintenance and Physical Exam  Immunization History  Administered Date(s) Administered   DTaP 05/01/1999, 06/27/1999,  09/05/1999, 05/21/2000, 12/05/2003   Hepatitis A 10/01/2007, 10/06/2008   Hepatitis B 02/16/1999, 03/26/1999, 09/05/1999   HiB (PRP-OMP) 05/01/1999, 06/27/1999, 09/05/1999, 05/21/2000   IPV 05/01/1999, 06/27/1999, 02/24/2000, 12/05/2003   Influenza,inj,Quad PF,6+ Mos 01/06/2014   Influenza-Unspecified 04/07/2012   MMR 02/23/2001, 12/05/2003   Meningococcal Polysaccharide 10/06/2011   Pneumococcal Conjugate-13 05/21/2000   Pneumococcal-Unspecified 09/05/1999, 11/19/1999   Tdap 10/06/2011   Varicella 02/24/2000, 10/01/2007    Health Maintenance  Topic Date Due   HIV Screening  Never done   Hepatitis C Screening  Never done   INFLUENZA VACCINE  11/26/2021   PAP-Cervical Cytology Screening  11/28/2021 (Originally 02/15/2020)   PAP SMEAR-Modifier  11/28/2021 (Originally 02/15/2020)   TETANUS/TDAP  11/28/2021 (Originally 10/05/2021)   HPV VACCINES  Discontinued   COVID-19 Vaccine  Discontinued    Discussed health benefits of physical activity, and encouraged her to engage in regular exercise appropriate for her age and condition.  Problem List Items Addressed This Visit   None Visit Diagnoses     Annual physical exam    -  Primary   Cervical cancer screening       Need for Tdap vaccination          No follow-ups on file.     Joy Jessup, NP   

## 2020-02-06 ENCOUNTER — Other Ambulatory Visit: Payer: Self-pay | Admitting: Podiatry

## 2020-02-06 NOTE — Telephone Encounter (Signed)
Please Advise

## 2020-03-01 ENCOUNTER — Ambulatory Visit: Payer: Managed Care, Other (non HMO) | Admitting: Orthopaedic Surgery

## 2020-03-01 ENCOUNTER — Other Ambulatory Visit: Payer: Self-pay | Admitting: Sports Medicine

## 2020-03-01 ENCOUNTER — Other Ambulatory Visit (HOSPITAL_COMMUNITY): Payer: Self-pay | Admitting: Sports Medicine

## 2020-03-01 DIAGNOSIS — M25551 Pain in right hip: Secondary | ICD-10-CM

## 2020-03-04 ENCOUNTER — Other Ambulatory Visit: Payer: Self-pay | Admitting: Podiatry

## 2020-03-16 ENCOUNTER — Other Ambulatory Visit: Payer: Self-pay

## 2020-03-16 ENCOUNTER — Ambulatory Visit (HOSPITAL_COMMUNITY)
Admission: RE | Admit: 2020-03-16 | Discharge: 2020-03-16 | Disposition: A | Discharge status: Home / Self Care | Payer: Managed Care, Other (non HMO) | Source: Ambulatory Visit | Attending: Sports Medicine | Admitting: Sports Medicine

## 2020-03-16 DIAGNOSIS — M25551 Pain in right hip: Secondary | ICD-10-CM | POA: Diagnosis not present

## 2020-03-16 MED ORDER — SODIUM CHLORIDE (PF) 0.9 % IJ SOLN
10.0000 mL | Freq: Once | INTRAMUSCULAR | Status: AC
  Administered 2020-03-16: 10 mL

## 2020-03-16 MED ORDER — LIDOCAINE HCL (PF) 1 % IJ SOLN
5.0000 mL | Freq: Once | INTRAMUSCULAR | Status: AC
  Administered 2020-03-16: 5 mL via INTRADERMAL

## 2020-03-16 MED ORDER — IOHEXOL 180 MG/ML  SOLN
10.0000 mL | Freq: Once | INTRAMUSCULAR | Status: AC | PRN
  Administered 2020-03-16: 10 mL via INTRA_ARTICULAR

## 2020-03-16 MED ORDER — GADOBUTROL 1 MMOL/ML IV SOLN
0.0500 mL | Freq: Once | INTRAVENOUS | Status: AC | PRN
  Administered 2020-03-16: 0.05 mL

## 2020-04-02 ENCOUNTER — Other Ambulatory Visit: Payer: Self-pay | Admitting: Podiatry

## 2020-04-02 NOTE — Telephone Encounter (Signed)
Please advise 

## 2021-02-08 ENCOUNTER — Telehealth: Payer: Managed Care, Other (non HMO) | Admitting: Physician Assistant

## 2021-02-08 DIAGNOSIS — B9689 Other specified bacterial agents as the cause of diseases classified elsewhere: Secondary | ICD-10-CM

## 2021-02-08 DIAGNOSIS — J208 Acute bronchitis due to other specified organisms: Secondary | ICD-10-CM | POA: Diagnosis not present

## 2021-02-08 MED ORDER — AZITHROMYCIN 250 MG PO TABS: ORAL_TABLET | ORAL | 0 refills | Status: DC

## 2021-02-08 MED ORDER — PREDNISONE 10 MG (21) PO TBPK: ORAL_TABLET | ORAL | 0 refills | Status: DC

## 2021-02-08 MED ORDER — BENZONATATE 100 MG PO CAPS: 100.0000 mg | ORAL_CAPSULE | Freq: Three times a day (TID) | ORAL | 0 refills | Status: DC | PRN

## 2021-02-08 NOTE — Progress Notes (Signed)
We are sorry that you are not feeling well.  Here is how we plan to help!  Based on your presentation I believe you most likely have A cough due to bacteria.  When patients have a fever and a productive cough with a change in color or increased sputum production, we are concerned about bacterial bronchitis.  If left untreated it can progress to pneumonia.  If your symptoms do not improve with your treatment plan it is important that you contact your provider.   I have prescribed Azithromyin 250 mg: two tablets now and then one tablet daily for 4 additonal days    In addition you may use A prescription cough medication called Tessalon Perles 100mg. You may take 1-2 capsules every 8 hours as needed for your cough.  Prednisone 10 mg daily for 6 days (see taper instructions below)  Directions for 6 day taper: Day 1: 2 tablets before breakfast, 1 after both lunch & dinner and 2 at bedtime Day 2: 1 tab before breakfast, 1 after both lunch & dinner and 2 at bedtime Day 3: 1 tab at each meal & 1 at bedtime Day 4: 1 tab at breakfast, 1 at lunch, 1 at bedtime Day 5: 1 tab at breakfast & 1 tab at bedtime Day 6: 1 tab at breakfast  From your responses in the eVisit questionnaire you describe inflammation in the upper respiratory tract which is causing a significant cough.  This is commonly called Bronchitis and has four common causes:   Allergies Viral Infections Acid Reflux Bacterial Infection Allergies, viruses and acid reflux are treated by controlling symptoms or eliminating the cause. An example might be a cough caused by taking certain blood pressure medications. You stop the cough by changing the medication. Another example might be a cough caused by acid reflux. Controlling the reflux helps control the cough.  USE OF BRONCHODILATOR ("RESCUE") INHALERS: There is a risk from using your bronchodilator too frequently.  The risk is that over-reliance on a medication which only relaxes the muscles  surrounding the breathing tubes can reduce the effectiveness of medications prescribed to reduce swelling and congestion of the tubes themselves.  Although you feel brief relief from the bronchodilator inhaler, your asthma may actually be worsening with the tubes becoming more swollen and filled with mucus.  This can delay other crucial treatments, such as oral steroid medications. If you need to use a bronchodilator inhaler daily, several times per day, you should discuss this with your provider.  There are probably better treatments that could be used to keep your asthma under control.     HOME CARE Only take medications as instructed by your medical team. Complete the entire course of an antibiotic. Drink plenty of fluids and get plenty of rest. Avoid close contacts especially the very young and the elderly Cover your mouth if you cough or cough into your sleeve. Always remember to wash your hands A steam or ultrasonic humidifier can help congestion.   GET HELP RIGHT AWAY IF: You develop worsening fever. You become short of breath You cough up blood. Your symptoms persist after you have completed your treatment plan MAKE SURE YOU  Understand these instructions. Will watch your condition. Will get help right away if you are not doing well or get worse.    Thank you for choosing an e-visit.  Your e-visit answers were reviewed by a board certified advanced clinical practitioner to complete your personal care plan. Depending upon the condition, your plan could   have included both over the counter or prescription medications.  Please review your pharmacy choice. Make sure the pharmacy is open so you can pick up prescription now. If there is a problem, you may contact your provider through MyChart messaging and have the prescription routed to another pharmacy.  Your safety is important to us. If you have drug allergies check your prescription carefully.   For the next 24 hours you can use  MyChart to ask questions about today's visit, request a non-urgent call back, or ask for a work or school excuse. You will get an email in the next two days asking about your experience. I hope that your e-visit has been valuable and will speed your recovery.  I provided 6 minutes of non face-to-face time during this encounter for chart review and documentation.   

## 2021-02-15 ENCOUNTER — Telehealth: Payer: Managed Care, Other (non HMO) | Admitting: Nurse Practitioner

## 2021-02-15 DIAGNOSIS — J069 Acute upper respiratory infection, unspecified: Secondary | ICD-10-CM

## 2021-02-15 NOTE — Progress Notes (Signed)

## 2021-06-06 ENCOUNTER — Other Ambulatory Visit (HOSPITAL_COMMUNITY)
Admission: RE | Admit: 2021-06-06 | Discharge: 2021-06-06 | Disposition: A | Discharge status: Home / Self Care | Payer: Managed Care, Other (non HMO) | Source: Ambulatory Visit | Attending: Student | Admitting: Student

## 2021-06-06 DIAGNOSIS — Z79899 Other long term (current) drug therapy: Secondary | ICD-10-CM | POA: Diagnosis not present

## 2021-06-06 DIAGNOSIS — Z5181 Encounter for therapeutic drug level monitoring: Secondary | ICD-10-CM | POA: Insufficient documentation

## 2021-06-06 LAB — CBC
HCT: 41.5 % (ref 36.0–46.0)
Hemoglobin: 13.6 g/dL (ref 12.0–15.0)
MCH: 29.3 pg (ref 26.0–34.0)
MCHC: 32.8 g/dL (ref 30.0–36.0)
MCV: 89.4 fL (ref 80.0–100.0)
Platelets: 271 10*3/uL (ref 150–400)
RBC: 4.64 MIL/uL (ref 3.87–5.11)
RDW: 12.1 % (ref 11.5–15.5)
WBC: 6.4 10*3/uL (ref 4.0–10.5)
nRBC: 0 % (ref 0.0–0.2)

## 2021-06-06 LAB — COMPREHENSIVE METABOLIC PANEL
ALT: 186 U/L — ABNORMAL HIGH (ref 0–44)
AST: 68 U/L — ABNORMAL HIGH (ref 15–41)
Albumin: 4 g/dL (ref 3.5–5.0)
Alkaline Phosphatase: 145 U/L — ABNORMAL HIGH (ref 38–126)
Anion gap: 10 (ref 5–15)
BUN: 11 mg/dL (ref 6–20)
CO2: 25 mmol/L (ref 22–32)
Calcium: 9.2 mg/dL (ref 8.9–10.3)
Chloride: 102 mmol/L (ref 98–111)
Creatinine, Ser: 0.8 mg/dL (ref 0.44–1.00)
GFR, Estimated: >60 mL/min (ref >60)
Glucose, Bld: 100 mg/dL — ABNORMAL HIGH (ref 70–99)
Potassium: 4.2 mmol/L (ref 3.5–5.1)
Sodium: 137 mmol/L (ref 135–145)
Total Bilirubin: 0.7 mg/dL (ref 0.3–1.2)
Total Protein: 7 g/dL (ref 6.5–8.1)

## 2021-06-06 LAB — PROTIME-INR
INR: 0.9 (ref 0.8–1.2)
Prothrombin Time: 12.2 seconds (ref 11.4–15.2)

## 2021-06-17 ENCOUNTER — Other Ambulatory Visit (HOSPITAL_COMMUNITY)
Admission: AD | Admit: 2021-06-17 | Discharge: 2021-06-17 | Disposition: A | Discharge status: Home / Self Care | Payer: Managed Care, Other (non HMO) | Source: Ambulatory Visit | Attending: Student | Admitting: Student

## 2021-06-17 DIAGNOSIS — Z5181 Encounter for therapeutic drug level monitoring: Secondary | ICD-10-CM | POA: Diagnosis present

## 2021-06-17 DIAGNOSIS — Z79899 Other long term (current) drug therapy: Secondary | ICD-10-CM | POA: Insufficient documentation

## 2021-06-17 LAB — COMPREHENSIVE METABOLIC PANEL
ALT: 43 U/L (ref 0–44)
AST: 22 U/L (ref 15–41)
Albumin: 4.1 g/dL (ref 3.5–5.0)
Alkaline Phosphatase: 102 U/L (ref 38–126)
Anion gap: 9 (ref 5–15)
BUN: 9 mg/dL (ref 6–20)
CO2: 25 mmol/L (ref 22–32)
Calcium: 9.3 mg/dL (ref 8.9–10.3)
Chloride: 101 mmol/L (ref 98–111)
Creatinine, Ser: 0.76 mg/dL (ref 0.44–1.00)
GFR, Estimated: >60 mL/min (ref >60)
Glucose, Bld: 111 mg/dL — ABNORMAL HIGH (ref 70–99)
Potassium: 3.8 mmol/L (ref 3.5–5.1)
Sodium: 135 mmol/L (ref 135–145)
Total Bilirubin: 0.6 mg/dL (ref 0.3–1.2)
Total Protein: 7.4 g/dL (ref 6.5–8.1)

## 2021-08-29 ENCOUNTER — Other Ambulatory Visit (HOSPITAL_COMMUNITY)
Admission: RE | Admit: 2021-08-29 | Discharge: 2021-08-29 | Disposition: A | Discharge status: Home / Self Care | Payer: Managed Care, Other (non HMO) | Attending: Student | Admitting: Student

## 2021-08-29 DIAGNOSIS — Z5181 Encounter for therapeutic drug level monitoring: Secondary | ICD-10-CM | POA: Insufficient documentation

## 2021-08-29 DIAGNOSIS — Z79899 Other long term (current) drug therapy: Secondary | ICD-10-CM | POA: Insufficient documentation

## 2021-08-29 LAB — COMPREHENSIVE METABOLIC PANEL
ALT: 38 U/L (ref 0–44)
AST: 20 U/L (ref 15–41)
Albumin: 3.9 g/dL (ref 3.5–5.0)
Alkaline Phosphatase: 121 U/L (ref 38–126)
Anion gap: 9 (ref 5–15)
BUN: 7 mg/dL (ref 6–20)
CO2: 25 mmol/L (ref 22–32)
Calcium: 9.1 mg/dL (ref 8.9–10.3)
Chloride: 103 mmol/L (ref 98–111)
Creatinine, Ser: 0.92 mg/dL (ref 0.44–1.00)
GFR, Estimated: >60 mL/min (ref >60)
Glucose, Bld: 164 mg/dL — ABNORMAL HIGH (ref 70–99)
Potassium: 3.8 mmol/L (ref 3.5–5.1)
Sodium: 137 mmol/L (ref 135–145)
Total Bilirubin: 0.5 mg/dL (ref 0.3–1.2)
Total Protein: 6.7 g/dL (ref 6.5–8.1)

## 2021-08-30 ENCOUNTER — Other Ambulatory Visit: Payer: Self-pay | Admitting: Gastroenterology

## 2021-08-30 DIAGNOSIS — R7989 Other specified abnormal findings of blood chemistry: Secondary | ICD-10-CM

## 2021-09-09 ENCOUNTER — Ambulatory Visit
Admission: RE | Admit: 2021-09-09 | Discharge: 2021-09-09 | Disposition: A | Discharge status: Home / Self Care | Payer: Managed Care, Other (non HMO) | Source: Ambulatory Visit | Attending: Gastroenterology | Admitting: Gastroenterology

## 2021-09-09 DIAGNOSIS — R7989 Other specified abnormal findings of blood chemistry: Secondary | ICD-10-CM

## 2022-03-10 ENCOUNTER — Encounter: Payer: Self-pay | Admitting: Internal Medicine

## 2022-03-10 ENCOUNTER — Ambulatory Visit (INDEPENDENT_AMBULATORY_CARE_PROVIDER_SITE_OTHER): Payer: Managed Care, Other (non HMO) | Admitting: Internal Medicine

## 2022-03-10 VITALS — BP 118/80 | HR 71 | Temp 98.0°F | Resp 16 | Ht 64.0 in | Wt 142.0 lb

## 2022-03-10 DIAGNOSIS — N200 Calculus of kidney: Secondary | ICD-10-CM | POA: Diagnosis not present

## 2022-03-10 LAB — POCT URINALYSIS DIPSTICK

## 2022-03-10 MED ORDER — KETOROLAC TROMETHAMINE 30 MG/ML IJ SOLN: 30.0000 mg | Freq: Once | INTRAMUSCULAR | Status: AC

## 2022-03-10 MED ORDER — HYDROCODONE-ACETAMINOPHEN 5-325 MG PO TABS: 1.0000 | ORAL_TABLET | Freq: Four times a day (QID) | ORAL | 0 refills | Status: AC | PRN

## 2022-03-10 NOTE — Progress Notes (Signed)
   Office Visit  Subjective   Patient ID: Kaitlyn Floyd   DOB: [birth year]   Age: 41 y.o.   MRN: [id number]   Chief Complaint No chief complaint on file.    History of Present Illness The patient is a 41 yo female who comes in today with left sided flank pain and mild hematuria.  She states this flank pain start last week about 5 days ago and has steadily worsened since then.  She states she has a continuous sharp pain rated 5 out of 10 however she will have a spasming pain every 5-10 min that will increase her pain level to 7 or 8 out of 10.  She has had kidney stones in the past with first kidney stone at age 35.  She has a history of hydronephrosis with stent placement in her 2nd pregnancy (unknown etiology of hydronephrosis) and she states the stent was eventually removed and her right sided hydronephrosis resolved.  She has been taking tylenol and ibuprofen which has not helped.  She has contacted urology where she has an appointment tomorrow with them.  She did see urgent care yesterday and they gave her an injection of ketorlac.       Past Medical History Past Medical History:  Diagnosis Date   Lupus (HCC)      Allergies No Known Allergies   Review of Systems Review of Systems  Constitutional:  Negative for chills, diaphoresis, fever and malaise/fatigue.  Respiratory:  Negative for cough and shortness of breath.   Cardiovascular:  Negative for chest pain and palpitations.  Gastrointestinal:  Negative for abdominal pain, constipation, diarrhea, nausea and vomiting.  Genitourinary:  Positive for flank pain and hematuria. Negative for dysuria, frequency and urgency.  Musculoskeletal:  Negative for myalgias.  Skin:  Negative for rash.  Neurological:  Negative for dizziness and headaches.       Objective:    Vitals BP 118/80 (BP Location: Left Arm, Patient Position: Sitting, Cuff Size: Normal)   Pulse 71   Temp 98 F (36.7 C) (Temporal)   Resp 16   Ht 5\' 4"  (1.626 m)    Wt 142 lb 0.2 oz (64.4 kg)   SpO2 97%   BMI 24.38 kg/m    Physical Examination Physical Exam Constitutional:      Appearance: Normal appearance. She is not ill-appearing.  Cardiovascular:     Rate and Rhythm: Normal rate and regular rhythm.     Pulses: Normal pulses.     Heart sounds: No murmur heard.    No friction rub. No gallop.  Pulmonary:     Effort: Pulmonary effort is normal. No respiratory distress.     Breath sounds: Normal breath sounds. No wheezing, rhonchi or rales.  Abdominal:     General: Abdomen is flat. Bowel sounds are normal. There is no distension.     Palpations: Abdomen is soft.     Tenderness: There is no abdominal tenderness. There is left CVA tenderness.  Musculoskeletal:     Right lower leg: No edema.     Left lower leg: No edema.  Skin:    General: Skin is warm and dry.     Findings: No rash.  Neurological:     Mental Status: She is alert.        Assessment & Plan:   No problem-specific Assessment & Plan notes found for this encounter.    No follow-ups on file.   , MD

## 2022-03-10 NOTE — Assessment & Plan Note (Addendum)
We did do a UA which showed blood but her LE and Nitrites were negative.  I think with her symptoms she is having a kidney stone.  I think she needs a CT urogram but she has an appointment with urology tomorrow.  I want her to continue with good fluids.  We will give her toradol 30mg  IM x 1 and give her hydrocodone/APAP 5/325mg  po q 6 prn x 5 days.  I reviewed her [place] controlled substance registry.  I do not want her to take her alprazolam with her hydrocodone/ApAP.

## 2023-03-09 ENCOUNTER — Other Ambulatory Visit: Payer: Self-pay | Admitting: Gastroenterology

## 2023-03-09 DIAGNOSIS — R1084 Generalized abdominal pain: Secondary | ICD-10-CM

## 2023-03-13 ENCOUNTER — Ambulatory Visit
Admission: RE | Admit: 2023-03-13 | Discharge: 2023-03-13 | Disposition: A | Discharge status: Home / Self Care | Payer: Managed Care, Other (non HMO) | Source: Ambulatory Visit | Attending: Gastroenterology | Admitting: Gastroenterology

## 2023-03-13 DIAGNOSIS — R1084 Generalized abdominal pain: Secondary | ICD-10-CM

## 2023-03-13 MED ORDER — IOPAMIDOL (ISOVUE-370) INJECTION 76%
75.0000 mL | Freq: Once | INTRAVENOUS | Status: AC | PRN
  Administered 2023-03-13: 75 mL via INTRAVENOUS

## 2023-03-18 ENCOUNTER — Other Ambulatory Visit: Payer: Managed Care, Other (non HMO)

## 2023-03-23 ENCOUNTER — Other Ambulatory Visit (HOSPITAL_COMMUNITY): Payer: Self-pay | Admitting: Gastroenterology

## 2023-03-23 DIAGNOSIS — R7401 Elevation of levels of liver transaminase levels: Secondary | ICD-10-CM

## 2023-04-10 ENCOUNTER — Other Ambulatory Visit (HOSPITAL_COMMUNITY): Payer: Self-pay | Admitting: Radiology

## 2023-04-10 DIAGNOSIS — R7989 Other specified abnormal findings of blood chemistry: Secondary | ICD-10-CM

## 2023-04-11 NOTE — Progress Notes (Signed)
Chief Complaint: Patient was seen in consultation today for image guided random core liver biopsy  Referring Physician(s): Hung,Patrick  Supervising Physician: Irish Lack  Patient Status: [facility] - Out-pt  History of Present Illness: Kaitlyn Floyd is a 42 y.o. female with PMH sig for lupus, nephrolithiasis, diverticulosis who presents now with elevated liver transaminase levels of uncertain etiology. CT A/P on 03/13/23 revealed: 1. No acute abnormality in the abdomen or pelvis. 2. Left-sided colonic diverticulosis without findings of acute diverticulitis. 3. Scattered bilobar hypodense hepatic lesions are technically too small to accurately characterize but stable from prior and favored benign.   She is scheduled today for image guided random core liver biopsy for further evaluation.   Past Medical History:  Diagnosis Date   Lupus (HCC)     Past Surgical History:  Procedure Laterality Date   ABDOMINAL HYSTERECTOMY     CHOLECYSTECTOMY     OOPHORECTOMY      Allergies: Patient has no known allergies.  Medications: Prior to Admission medications   Medication Sig Start Date End Date Taking? Authorizing Provider  albuterol (PROVENTIL) (2.5 MG/3ML) 0.083% nebulizer solution albuterol sulfate 2.5 mg/3 mL (0.083 %) solution for nebulization    [provider]  albuterol (VENTOLIN HFA) 108 (90 Base) MCG/ACT inhaler Inhale into the lungs.    [provider]  azithromycin (ZITHROMAX) 250 MG tablet Take 2 tablets PO on day one, and one tablet PO daily thereafter until completed. 02/08/21   Margaretann Loveless, PA-C  benzonatate (TESSALON) 100 MG capsule Take 1 capsule (100 mg total) by mouth 3 (three) times daily as needed. 02/08/21   Margaretann Loveless, PA-C  estradiol (ESTRACE) 1 MG tablet Take 1 mg by mouth daily.    [provider]  estradiol (VIVELLE-DOT) 0.025 MG/24HR APPLY 1 PATCH TRANSDERMALLY TWICE A WEEK 04/02/17   [provider]  levocetirizine (XYZAL) 5 MG tablet levocetirizine 5 mg tablet    [provider]  montelukast (SINGULAIR) 10 MG tablet montelukast 10 mg tablet    [provider]  nabumetone (RELAFEN) 750 MG tablet Take 750 mg by mouth 2 (two) times daily.    [provider]  predniSONE (STERAPRED UNI-PAK 21 TAB) 10 MG (21) TBPK tablet 6 day taper; take as directed on package instructions 02/08/21   Margaretann Loveless, PA-C  Vitamin D, Ergocalciferol, (DRISDOL) 1.25 MG (50000 UNIT) CAPS capsule TAKE 1 CAPSULE (50,000 UNITS TOTAL) BY MOUTH EVERY 7 (SEVEN) DAYS. 03/06/20   Park Liter, DPM     No family history on file.  Social History   Socioeconomic History   Marital status: Married    Spouse name: Not on file   Number of children: Not on file   Years of education: Not on file   Highest education level: Not on file  Occupational History   Not on file  Tobacco Use   Smoking status: Former   Smokeless tobacco: Never  Substance and Sexual Activity   Alcohol use: No   Drug use: Not on file   Sexual activity: Not on file  Other Topics Concern   Not on file  Social History Narrative   Not on file   Social Drivers of Health   Financial Resource Strain: Not on file  Food Insecurity: No Food Insecurity (08/06/2021)   Received from [facility], [facility]   Hunger Vital Sign    Worried About Running Out of Food in the Last Year: Never true    Ran  Out of Food in the Last Year: Never true  Transportation Needs: Not on file  Physical Activity: Not on file  Stress: Not on file  Social Connections: Unknown (08/28/2021)   Received from [facility], [facility]   Social Network    Social Network: Not on file      Review of Systems: denies fever,HA,CP, dyspnea, cough, abd/back pain,N/V or bleeding  Vital Signs: Vitals:   04/13/23 1054 04/13/23 1058  BP:  119/82  Pulse:  62  Resp:  16  Temp: 98 F (36.7 C) 98.5 F (36.9 C)  SpO2:  99%       Code Status: FULL CODE    Physical Exam: awake/alert; chest- CTA bilat; heart- RRR; abd-soft,+BS,NT; no LE edema  Imaging: CT ABDOMEN PELVIS W CONTRAST Result Date: 03/14/2023 CLINICAL DATA:  Unexplained weight loss. Diarrhea. * Tracking Code: BO * . EXAM: CT ABDOMEN AND PELVIS WITH CONTRAST TECHNIQUE: Multidetector CT imaging of the abdomen and pelvis was performed using the standard protocol following bolus administration of intravenous contrast. RADIATION DOSE REDUCTION: This exam was performed according to the departmental dose-optimization program which includes automated exposure control, adjustment of the mA and/or kV according to patient size and/or use of iterative reconstruction technique. CONTRAST:  75mL ISOVUE-370 IOPAMIDOL (ISOVUE-370) INJECTION 76% COMPARISON:  Ultrasound Sep 09, 2021 CT June 02, 2022 FINDINGS: Lower chest: Motion degraded examination of the lung bases. Hepatobiliary: Scattered bilobar hypodense hepatic lesions are technically too small to accurately characterize but stable from prior and favored benign. Gallbladder surgically absent. No biliary ductal dilation. Pancreas: No pancreatic ductal dilation or evidence of acute inflammation. Spleen: No splenomegaly. Adrenals/Urinary Tract: Bilateral adrenal glands appear normal. No hydronephrosis. Kidneys demonstrate symmetric enhancement. Urinary bladder is unremarkable for degree of distension within limitation of evaluation secondary to streak artifact from right hip arthroplasty. Stomach/Bowel: Radiopaque enteric contrast material traverses the splenic flexure. Stomach is minimally distended limiting evaluation. No pathologic dilation of small or large bowel. Small volume of formed stool in the colon. Left-sided colonic diverticulosis without findings of acute diverticulitis. Vascular/Lymphatic: Normal caliber abdominal aorta. Smooth IVC contours. The portal, splenic and superior mesenteric veins are patent. No  pathologically enlarged abdominal or pelvic lymph nodes. Reproductive: Status post hysterectomy. No adnexal masses. Other: No significant abdominopelvic free fluid. Musculoskeletal: Right total hip arthroplasty. IMPRESSION: 1. No acute abnormality in the abdomen or pelvis. 2. Left-sided colonic diverticulosis without findings of acute diverticulitis. 3. Scattered bilobar hypodense hepatic lesions are technically too small to accurately characterize but stable from prior and favored benign. Electronically Signed   By: Maudry Mayhew M.D.   On: 03/14/2023 09:11    Labs:  CBC: No results for input(s): "WBC", "HGB", "HCT", "PLT" in the last 8760 hours.  COAGS: No results for input(s): "INR", "APTT" in the last 8760 hours.  BMP: No results for input(s): "NA", "K", "CL", "CO2", "GLUCOSE", "BUN", "CALCIUM", "CREATININE", "GFRNONAA", "GFRAA" in the last 8760 hours.  Invalid input(s): "CMP"  LIVER FUNCTION TESTS: No results for input(s): "BILITOT", "AST", "ALT", "ALKPHOS", "PROT", "ALBUMIN" in the last 8760 hours.  TUMOR MARKERS: No results for input(s): "AFPTM", "CEA", "CA199", "CHROMGRNA" in the last 8760 hours.  Assessment and Plan: 42 y.o. female with PMH sig for lupus, nephrolithiasis, diverticulosis who presents now with elevated liver transaminase levels of uncertain etiology. CT A/P on 03/13/23 revealed: 1. No acute abnormality in the abdomen or pelvis. 2. Left-sided colonic diverticulosis without findings of acute diverticulitis. 3. Scattered bilobar hypodense hepatic lesions are technically too small  to accurately characterize but stable from prior and favored benign.   She is scheduled today for image guided random core liver biopsy for further evaluation. Risks and benefits of procedure was discussed with the patient  including, but not limited to bleeding, infection, damage to adjacent structures or low yield requiring additional tests.  All of the questions were answered and  there is agreement to proceed.  Consent signed and in chart.    Thank you for this interesting consult.  I greatly enjoyed meeting Kaitlyn Floyd and look forward to participating in their care.  A copy of this report was sent to the requesting provider on this date.  Electronically Signed: D. Jeananne Rama, PA-C 04/11/2023, 9:18 PM   I spent a total of   25 minutes  in face to face in clinical consultation, greater than 50% of which was counseling/coordinating care for image guided random core liver biopsy

## 2023-04-12 ENCOUNTER — Other Ambulatory Visit: Payer: Self-pay | Admitting: Student

## 2023-04-13 ENCOUNTER — Ambulatory Visit (HOSPITAL_COMMUNITY)
Admission: RE | Admit: 2023-04-13 | Discharge: 2023-04-13 | Disposition: A | Discharge status: Home / Self Care | Payer: Managed Care, Other (non HMO) | Source: Ambulatory Visit | Attending: Family Medicine | Admitting: Family Medicine

## 2023-04-13 ENCOUNTER — Ambulatory Visit (HOSPITAL_COMMUNITY)
Admission: RE | Admit: 2023-04-13 | Discharge: 2023-04-13 | Disposition: A | Discharge status: Home / Self Care | Payer: Managed Care, Other (non HMO) | Source: Ambulatory Visit | Attending: Gastroenterology | Admitting: Gastroenterology

## 2023-04-13 ENCOUNTER — Encounter (HOSPITAL_COMMUNITY): Payer: Self-pay

## 2023-04-13 DIAGNOSIS — R7989 Other specified abnormal findings of blood chemistry: Secondary | ICD-10-CM | POA: Diagnosis present

## 2023-04-13 DIAGNOSIS — K5730 Diverticulosis of large intestine without perforation or abscess without bleeding: Secondary | ICD-10-CM | POA: Diagnosis not present

## 2023-04-13 DIAGNOSIS — M329 Systemic lupus erythematosus, unspecified: Secondary | ICD-10-CM | POA: Insufficient documentation

## 2023-04-13 DIAGNOSIS — R7401 Elevation of levels of liver transaminase levels: Secondary | ICD-10-CM | POA: Insufficient documentation

## 2023-04-13 DIAGNOSIS — K769 Liver disease, unspecified: Secondary | ICD-10-CM | POA: Diagnosis not present

## 2023-04-13 LAB — PROTIME-INR
INR: 1 (ref 0.8–1.2)
Prothrombin Time: 13 s (ref 11.4–15.2)

## 2023-04-13 LAB — CBC
HCT: 38.5 % (ref 36.0–46.0)
Hemoglobin: 12.7 g/dL (ref 12.0–15.0)
MCH: 30.5 pg (ref 26.0–34.0)
MCHC: 33 g/dL (ref 30.0–36.0)
MCV: 92.5 fL (ref 80.0–100.0)
Platelets: 200 10*3/uL (ref 150–400)
RBC: 4.16 MIL/uL (ref 3.87–5.11)
RDW: 12.5 % (ref 11.5–15.5)
WBC: 4.5 10*3/uL (ref 4.0–10.5)
nRBC: 0 % (ref 0.0–0.2)

## 2023-04-13 MED ORDER — HYDROCODONE-ACETAMINOPHEN 5-325 MG PO TABS
1.0000 | ORAL_TABLET | Freq: Four times a day (QID) | ORAL | Status: DC | PRN
  Administered 2023-04-13: 2 via ORAL
  Filled 2023-04-13: qty 2

## 2023-04-13 MED ORDER — HYDROCODONE-ACETAMINOPHEN 5-325 MG PO TABS: 1.0000 | ORAL_TABLET | Freq: Four times a day (QID) | ORAL | 0 refills | Status: AC | PRN

## 2023-04-13 MED ORDER — FENTANYL CITRATE (PF) 100 MCG/2ML IJ SOLN
INTRAMUSCULAR | Status: AC
  Filled 2023-04-13: qty 2

## 2023-04-13 MED ORDER — HYDROMORPHONE HCL 1 MG/ML IJ SOLN
1.0000 mg | Freq: Once | INTRAMUSCULAR | Status: AC
  Administered 2023-04-13: 1 mg via INTRAVENOUS
  Filled 2023-04-13: qty 1

## 2023-04-13 MED ORDER — MIDAZOLAM HCL 2 MG/2ML IJ SOLN
INTRAMUSCULAR | Status: AC
  Filled 2023-04-13: qty 4

## 2023-04-13 MED ORDER — GELATIN ABSORBABLE 12-7 MM EX MISC
CUTANEOUS | Status: AC
  Filled 2023-04-13: qty 1

## 2023-04-13 MED ORDER — SODIUM CHLORIDE 0.9% FLUSH: 3.0000 mL | Freq: Two times a day (BID) | INTRAVENOUS | Status: DC

## 2023-04-13 MED ORDER — SODIUM CHLORIDE 0.9 % IV SOLN
INTRAVENOUS | Status: DC
Start: 2023-04-13 — End: 2023-04-13

## 2023-04-13 MED ORDER — FENTANYL CITRATE (PF) 100 MCG/2ML IJ SOLN
INTRAMUSCULAR | Status: AC | PRN
  Administered 2023-04-13 (×4): 50 ug via INTRAVENOUS

## 2023-04-13 MED ORDER — LIDOCAINE HCL 1 % IJ SOLN
INTRAMUSCULAR | Status: AC
  Filled 2023-04-13: qty 20

## 2023-04-13 MED ORDER — MIDAZOLAM HCL 2 MG/2ML IJ SOLN
INTRAMUSCULAR | Status: AC | PRN
  Administered 2023-04-13: 2 mg via INTRAVENOUS
  Administered 2023-04-13: 1 mg via INTRAVENOUS

## 2023-04-13 NOTE — Procedures (Signed)
Interventional Radiology Procedure Note ° °Procedure: US Guided Biopsy of liver ° °Complications: None ° °Estimated Blood Loss: < 10 mL ° °Findings: °18 G core biopsy of liver performed under US guidance.  Three core samples obtained and sent to Pathology. ° °Skippy Marhefka T. Khilynn Borntreger, M.D °Pager:  [phone] ° °  °

## 2023-04-13 NOTE — Progress Notes (Signed)
Patient ID: Kaitlyn Floyd, female   DOB: [birth year], 43 y.o.   MRN: [id number] Pt s/p image guided random right liver bx earlier today; now c/o mod epigastric discomfort with some radiation to LUQ- about 5-7/10; some brief relief with IV dilaudid; denies fever,N/V; vitals are stable; puncture site RUQ clean, soft, dry, no visible bleeding; mild-mod tender epigastric region; findings d/w Dr. Fredia Sorrow- administer po vicodin now with electronic prescription for vicodin/norco 5/325 sent to pt's pharmacy, 15 total tablets, no refills; cont to monitor for now; pt/spouse updated with plans; pt told to contact IR service should symptoms worsen following discharge

## 2023-04-14 LAB — SURGICAL PATHOLOGY

## 2023-04-19 ENCOUNTER — Telehealth: Payer: Self-pay | Admitting: Physician Assistant

## 2023-04-19 NOTE — Telephone Encounter (Signed)
She has lupus, she is a Engineer, civil (consulting) works at [employer], she has never had pancreatitis in the past but she is concerned this is what it is.  Had liver biopsy on the 16th, had some epigastric left sided under her rib pain after the biopsy but this resolved with Vicodin.  She has baseline nausea, no vomiting.  Then since Friday she has had worsening left sided AB pain, with pain straight through/radiating around her ribs to her back.  No fever, chills. Nausea, no vomiting.  No rash, no bruising. Not worse with position, worse with food, cramping after eating with diaphoresis.  Patient has dicyclomine house but has not tried it.  She has not taken any Tylenol.  Instructed her to go on low-fat diet, liquid diet, take dicyclomine and Tylenol, sounds like it could be spasm. She will call Dr. Haywood Pao office Monday and discussed with them may be getting CBC, c-Met, lipase/amylase or what he sees fit. ER precautions discussed with the patient

## 2023-04-20 ENCOUNTER — Telehealth: Payer: Managed Care, Other (non HMO) | Admitting: Physician Assistant

## 2023-04-20 DIAGNOSIS — J019 Acute sinusitis, unspecified: Secondary | ICD-10-CM

## 2023-04-20 DIAGNOSIS — B9689 Other specified bacterial agents as the cause of diseases classified elsewhere: Secondary | ICD-10-CM | POA: Diagnosis not present

## 2023-04-20 MED ORDER — ALBUTEROL SULFATE HFA 108 (90 BASE) MCG/ACT IN AERS
2.0000 | INHALATION_SPRAY | Freq: Four times a day (QID) | RESPIRATORY_TRACT | 0 refills | Status: DC | PRN
Start: 2023-04-20 — End: 2023-09-29

## 2023-04-20 MED ORDER — BENZONATATE 100 MG PO CAPS: 100.0000 mg | ORAL_CAPSULE | Freq: Three times a day (TID) | ORAL | 0 refills | Status: DC | PRN

## 2023-04-20 MED ORDER — DOXYCYCLINE HYCLATE 100 MG PO TABS: 100.0000 mg | ORAL_TABLET | Freq: Two times a day (BID) | ORAL | 0 refills | Status: AC

## 2023-04-20 MED ORDER — PREDNISONE 10 MG (21) PO TBPK: ORAL_TABLET | ORAL | 0 refills | Status: AC

## 2023-04-20 NOTE — Patient Instructions (Signed)
Kaitlyn Floyd, thank you for joining [name], PA-C for today's virtual visit.  While this provider is not your primary care provider (PCP), if your PCP is located in our provider database this encounter information will be shared with them immediately following your visit.   A [facility] MyChart account gives you access to today's visit and all your visits, tests, and labs performed at [facility] " click here if you don't have a [name] account or go to [email or web address]  Consent: (Patient) Kaitlyn Floyd provided verbal consent for this virtual visit at the beginning of the encounter.  Current Medications:  Current Outpatient Medications:    albuterol (VENTOLIN HFA) 108 (90 Base) MCG/ACT inhaler, Inhale 2 puffs into the lungs every 6 (six) hours as needed for wheezing or shortness of breath., Disp: 8 g, Rfl: 0   benzonatate (TESSALON) 100 MG capsule, Take 1 capsule (100 mg total) by mouth 3 (three) times daily as needed for cough., Disp: 30 capsule, Rfl: 0   doxycycline (VIBRA-TABS) 100 MG tablet, Take 1 tablet (100 mg total) by mouth 2 (two) times daily., Disp: 14 tablet, Rfl: 0   estradiol (ESTRACE) 1 MG tablet, Take 1 mg by mouth daily., Disp: , Rfl:    HYDROcodone-acetaminophen (NORCO) 5-325 MG tablet, Take 1-2 tablets by mouth every 6 (six) hours as needed for moderate pain (pain score 4-6)., Disp: 15 tablet, Rfl: 0   sertraline (ZOLOFT) 50 MG tablet, Take 150 mg by mouth daily., Disp: , Rfl:    Medications ordered in this encounter:  Meds ordered this encounter  Medications   albuterol (VENTOLIN HFA) 108 (90 Base) MCG/ACT inhaler    Sig: Inhale 2 puffs into the lungs every 6 (six) hours as needed for wheezing or shortness of breath.    Dispense:  8 g    Refill:  0    Supervising Provider:   Merrilee Jansky [[id number]]   benzonatate (TESSALON) 100 MG capsule    Sig: Take 1 capsule (100 mg total) by mouth 3 (three) times daily as needed  for cough.    Dispense:  30 capsule    Refill:  0    Supervising Provider:   Merrilee Jansky [[id number]]   doxycycline (VIBRA-TABS) 100 MG tablet    Sig: Take 1 tablet (100 mg total) by mouth 2 (two) times daily.    Dispense:  14 tablet    Refill:  0    Supervising Provider:   Merrilee Jansky [[id number]]     *If you need refills on other medications prior to your next appointment, please contact your pharmacy*  Follow-Up: Call back or seek an in-person evaluation if the symptoms worsen or if the condition fails to improve as anticipated.  [facility] [phone]  Other Instructions Please take antibiotic as directed.  Increase fluid intake.  Use Saline nasal spray.  Take a daily multivitamin. Use the Tessalon and Albuterol.  Place a humidifier in the bedroom.  Please call or return clinic if symptoms are not improving.   Sinusitis Sinusitis is redness, soreness, and swelling (inflammation) of the paranasal sinuses. Paranasal sinuses are air pockets within the bones of your face (beneath the eyes, the middle of the forehead, or above the eyes). In healthy paranasal sinuses, mucus is able to drain out, and air is able to circulate through them by way of your nose. However, when your paranasal sinuses are inflamed, mucus and air can become trapped. This can  allow bacteria and other germs to grow and cause infection. Sinusitis can develop quickly and last only a short time (acute) or continue over a long period (chronic). Sinusitis that lasts for more than 12 weeks is considered chronic.  CAUSES  Causes of sinusitis include: Allergies. Structural abnormalities, such as displacement of the cartilage that separates your nostrils (deviated septum), which can decrease the air flow through your nose and sinuses and affect sinus drainage. Functional abnormalities, such as when the small hairs (cilia) that line your sinuses and help remove mucus do not work properly or are not  present. SYMPTOMS  Symptoms of acute and chronic sinusitis are the same. The primary symptoms are pain and pressure around the affected sinuses. Other symptoms include: Upper toothache. Earache. Headache. Bad breath. Decreased sense of smell and taste. A cough, which worsens when you are lying flat. Fatigue. Fever. Thick drainage from your nose, which often is green and may contain pus (purulent). Swelling and warmth over the affected sinuses. DIAGNOSIS  Your caregiver will perform a physical exam. During the exam, your caregiver may: Look in your nose for signs of abnormal growths in your nostrils (nasal polyps). Tap over the affected sinus to check for signs of infection. View the inside of your sinuses (endoscopy) with a special imaging device with a light attached (endoscope), which is inserted into your sinuses. If your caregiver suspects that you have chronic sinusitis, one or more of the following tests may be recommended: Allergy tests. Nasal culture A sample of mucus is taken from your nose and sent to a lab and screened for bacteria. Nasal cytology A sample of mucus is taken from your nose and examined by your caregiver to determine if your sinusitis is related to an allergy. TREATMENT  Most cases of acute sinusitis are related to a viral infection and will resolve on their own within 10 days. Sometimes medicines are prescribed to help relieve symptoms (pain medicine, decongestants, nasal steroid sprays, or saline sprays).  However, for sinusitis related to a bacterial infection, your caregiver will prescribe antibiotic medicines. These are medicines that will help kill the bacteria causing the infection.  Rarely, sinusitis is caused by a fungal infection. In theses cases, your caregiver will prescribe antifungal medicine. For some cases of chronic sinusitis, surgery is needed. Generally, these are cases in which sinusitis recurs more than 3 times per year, despite other  treatments. HOME CARE INSTRUCTIONS  Drink plenty of water. Water helps thin the mucus so your sinuses can drain more easily. Use a humidifier. Inhale steam 3 to 4 times a day (for example, sit in the bathroom with the shower running). Apply a warm, moist washcloth to your face 3 to 4 times a day, or as directed by your caregiver. Use saline nasal sprays to help moisten and clean your sinuses. Take over-the-counter or prescription medicines for pain, discomfort, or fever only as directed by your caregiver. SEEK IMMEDIATE MEDICAL CARE IF: You have increasing pain or severe headaches. You have nausea, vomiting, or drowsiness. You have swelling around your face. You have vision problems. You have a stiff neck. You have difficulty breathing. MAKE SURE YOU:  Understand these instructions. Will watch your condition. Will get help right away if you are not doing well or get worse. Document Released: 04/14/2005 Document Revised: 07/07/2011 Document Reviewed: 04/29/2011 [facility] Patient Information 2014 [place], [place].    If you have been instructed to have an in-person evaluation today at a local Urgent Care facility, please use the  link below. It will take you to a list of all of our available [facility], including address, phone number and hours of operation. Please do not delay care.  [facility]  If you or a family member do not have a primary care provider, use the link below to schedule a visit and establish care. When you choose a [facility] primary care physician or advanced practice provider, you gain a long-term partner in health. Find a Primary Care Provider  Learn more about [facility] in-office and virtual care options: [facility] - Get Care Now

## 2023-04-20 NOTE — Progress Notes (Signed)
Virtual Visit Consent   Kaitlyn Floyd, you are scheduled for a virtual visit with a [facility] provider today. Just as with appointments in the office, your consent must be obtained to participate. Your consent will be active for this visit and any virtual visit you may have with one of our providers in the next 365 days. If you have a MyChart account, a copy of this consent can be sent to you electronically.  As this is a virtual visit, video technology does not allow for your provider to perform a traditional examination. This may limit your provider's ability to fully assess your condition. If your provider identifies any concerns that need to be evaluated in person or the need to arrange testing (such as labs, EKG, etc.), we will make arrangements to do so. Although advances in technology are sophisticated, we cannot ensure that it will always work on either your end or our end. If the connection with a video visit is poor, the visit may have to be switched to a telephone visit. With either a video or telephone visit, we are not always able to ensure that we have a secure connection.  By engaging in this virtual visit, you consent to the provision of healthcare and authorize for your insurance to be billed (if applicable) for the services provided during this visit. Depending on your insurance coverage, you may receive a charge related to this service.  I need to obtain your verbal consent now. Are you willing to proceed with your visit today? [name] has provided verbal consent on 04/20/2023 for a virtual visit (video or telephone). Kaitlyn Floyd, [place]  Date: 04/20/2023 7:25 PM  Virtual Visit via Video Note   I, Kaitlyn Floyd, connected with  Kaitlyn Floyd  ([id number], [birth year]) on 04/20/23 at  7:30 PM EST by a video-enabled telemedicine application and verified that I am speaking with the correct person using two identifiers.  Location: Patient: Virtual Visit Location  Patient: Home Provider: Virtual Visit Location Provider: Home Office   I discussed the limitations of evaluation and management by telemedicine and the availability of in person appointments. The patient expressed understanding and agreed to proceed.    History of Present Illness: Kaitlyn Floyd is a 42 y.o. who identifies as a female who was assigned female at birth, and is being seen today for nasal congestion, sinus pressure, thick green nasal discharge, cough worse with recumbent positioning. Some associated chest tightness for which she has tried to use an expired albuterol inhaler she still had with some improvement. Denies fever, chills.   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    Allergies: No Known Allergies Medications:  Current Outpatient Medications:    albuterol (VENTOLIN HFA) 108 (90 Base) MCG/ACT inhaler, Inhale 2 puffs into the lungs every 6 (six) hours as needed for wheezing or shortness of breath., Disp: 8 g, Rfl: 0   benzonatate (TESSALON) 100 MG capsule, Take 1 capsule (100 mg total) by mouth 3 (three) times daily as needed for cough., Disp: 30 capsule, Rfl: 0   doxycycline (VIBRA-TABS) 100 MG tablet, Take 1 tablet (100 mg total) by mouth 2 (two) times daily., Disp: 14 tablet, Rfl: 0   predniSONE (STERAPRED UNI-PAK 21 TAB)  10 MG (21) TBPK tablet, Take following package directions, Disp: 21 tablet, Rfl: 0   estradiol (ESTRACE) 1 MG tablet, Take 1 mg by mouth daily., Disp: , Rfl:    HYDROcodone-acetaminophen (NORCO) 5-325 MG tablet, Take 1-2 tablets by mouth every 6 (six) hours as needed for moderate pain (pain score 4-6)., Disp: 15 tablet, Rfl: 0   sertraline (ZOLOFT) 50 MG tablet, Take 150 mg by mouth daily., Disp: , Rfl:    Observations/Objective: Patient is well-developed, well-nourished in no acute distress.  Resting comfortably at home.  Head is normocephalic, atraumatic.  No labored breathing. Speech is clear and coherent with logical content.  Patient is alert and oriented at baseline.  Assessment and Plan: 1. Acute bacterial sinusitis (Primary) - albuterol (VENTOLIN HFA) 108 (90 Base) MCG/ACT inhaler; Inhale 2 puffs into the lungs every 6 (six) hours as needed for wheezing or shortness of breath.  Dispense: 8 g; Refill: 0 - benzonatate (TESSALON) 100 MG capsule; Take 1 capsule (100 mg total) by mouth 3 (three) times daily as needed for cough.  Dispense: 30 capsule; Refill: 0 - doxycycline (VIBRA-TABS) 100 MG tablet; Take 1 tablet (100 mg total) by mouth 2 (two) times daily.  Dispense: 14 tablet; Refill: 0 - predniSONE (STERAPRED UNI-PAK 21 TAB) 10 MG (21) TBPK tablet; Take following package directions  Dispense: 21 tablet; Refill: 0  Rx Doxycycline.  Increase fluids.  Rest.  Saline nasal spray.  Probiotic.  Mucinex as directed.  Humidifier in bedroom. Tessalon and albuterol per orders.  Call or return to clinic if symptoms are not improving.   Follow Up Instructions: I discussed the assessment and treatment plan with the patient. The patient was provided an opportunity to ask questions and all were answered. The patient agreed with the plan and demonstrated an understanding of the instructions.  A copy of instructions were sent to the patient via MyChart unless otherwise noted below.   The patient was advised to call back or seek an in-person evaluation if the symptoms worsen or if the condition fails to improve as anticipated.    Kaitlyn Climes, PA-C

## 2023-08-19 ENCOUNTER — Encounter: Payer: Self-pay | Admitting: Neurology

## 2023-09-28 NOTE — Progress Notes (Signed)
 [facility] Clinic Note - Initial Visit   Date: 09/29/2023   Kaitlyn Floyd MRN: [id number] DOB: [birth year]   Dear Dr. Robbin Chill:  Thank you for your kind referral of Kaitlyn Floyd for consultation of imbalance and numbness/tingling. Although her history is well known to you, please allow us  to reiterate it for the purpose of our medical record. The patient was accompanied to the clinic by self.    Kaitlyn Floyd is a 43 y.o. right-handed female with anxiety/depression presenting for evaluation of imbalance and numbness/tingling of the hands.   IMPRESSION/PLAN: Myriad of symptoms including paresthesias of the hands and feet, imbalance, and memory changes.  She scored well on cognitive screening testing (MOCA 25/30) and neurological exam show mildly positive Tinel's at the left wrist, otherwise is normal.  She may have carpal tunnel syndrome in the hands. To further evaluate her symptoms, testing will be ordered as follows:  - MRI brain wo contrast  - NCS/EMG of the arms  - Check vitamin B12, folate  Further recommendations pending results.   ------------------------------------------------------------- History of present illness: Starting around 2024, she began having vibration sensation, numbness/tingling involving the feet, which occurs 1-2 times per week and lasts a few minutes.    She also experiences numbness/tingling of the hands, following by shooting pain.  She is dropping things and has difficulty opening bottles/jars.  She has some neck soreness.  She has history of Raynaud's.    She has imbalance and feel unsteady.  She has not had any falls.  She also had word-finding difficulty and memory concerns.  She is able to carry out her IADLs.      She does not smoke or drink alcohol. TSH is normal.  She works as Charity fundraiser at [employer]    Past Medical History:  Diagnosis Date   Lupus     Past Surgical History:  Procedure Laterality Date   ABDOMINAL  HYSTERECTOMY     CHOLECYSTECTOMY     OOPHORECTOMY       Medications:  Outpatient Encounter Medications as of 09/29/2023  Medication Sig Note   ALPRAZolam (XANAX) 0.25 MG tablet Take 0.25 mg by mouth 3 (three) times daily.    Sertraline HCl 200 MG CAPS Take 200 mg by mouth daily.    doxycycline  (VIBRA -TABS) 100 MG tablet Take 1 tablet (100 mg total) by mouth 2 (two) times daily. (Patient not taking: Reported on 09/29/2023)    estradiol (ESTRACE) 1 MG tablet Take 1 mg by mouth daily. (Patient not taking: Reported on 09/29/2023) 04/13/2023: Pt states she is currently not using    HYDROcodone -acetaminophen  (NORCO) 5-325 MG tablet Take 1-2 tablets by mouth every 6 (six) hours as needed for moderate pain (pain score 4-6). (Patient not taking: Reported on 09/29/2023)    predniSONE  (STERAPRED UNI-PAK 21 TAB) 10 MG (21) TBPK tablet Take following package directions (Patient not taking: Reported on 09/29/2023)    [DISCONTINUED] albuterol  (VENTOLIN  HFA) 108 (90 Base) MCG/ACT inhaler Inhale 2 puffs into the lungs every 6 (six) hours as needed for wheezing or shortness of breath. (Patient not taking: Reported on 09/29/2023)    [DISCONTINUED] benzonatate  (TESSALON ) 100 MG capsule Take 1 capsule (100 mg total) by mouth 3 (three) times daily as needed for cough. (Patient not taking: Reported on 09/29/2023)    No facility-administered encounter medications on file as of 09/29/2023.    Allergies: No Known Allergies  Family History: History reviewed. No pertinent family history.  Social History: Social History  Tobacco Use   Smoking status: Former   Smokeless tobacco: Never  Vaping Use   Vaping status: Never Used  Substance Use Topics   Alcohol use: No   Drug use: Never   Social History   Social History Narrative   Are you right handed or left handed? Right Handed    Are you currently employed ?    What is your current occupation?   Do you live at home alone? No    Who lives with you? Husband and daughter     What type of home do you live in: 1 story or 2 story? Lives in a three story home         Vital Signs:  BP 116/71   Pulse (!) 58   Ht 5\' 4"  (1.626 m)   Wt 127 lb (57.6 kg)   SpO2 100%   BMI 21.80 kg/m     Neurological Exam: MENTAL STATUS including orientation to time, place, person, recent and remote memory, attention span and concentration, language, and fund of knowledge is normal.  Speech is not dysarthric.     09/29/2023   10:54 AM  Montreal Cognitive Assessment   Visuospatial/ Executive (0/5) 4  Naming (0/3) 3  Attention: Read list of digits (0/2) 2  Attention: Read list of letters (0/1) 1  Attention: Serial 7 subtraction starting at 100 (0/3) 2  Language: Repeat phrase (0/2) 2  Language : Fluency (0/1) 1  Abstraction (0/2) 0  Delayed Recall (0/5) 4  Orientation (0/6) 6  Total 25  Adjusted Score (based on education) 25    CRANIAL NERVES: II:  No visual field defects.     III-IV-VI: Pupils equal round and reactive to light.  Normal conjugate, extra-ocular eye movements in all directions of gaze.  No nystagmus.  No ptosis.   V:  Normal facial sensation.    VII:  Normal facial symmetry and movements.   VIII:  Normal hearing and vestibular function.   IX-X:  Normal palatal movement.   XI:  Normal shoulder shrug and head rotation.   XII:  Normal tongue strength and range of motion, no deviation or fasciculation.  MOTOR:  No atrophy, fasciculations or abnormal movements.  No pronator drift.   Upper Extremity:  Right  Left  Deltoid  5/5   5/5   Biceps  5/5   5/5   Triceps  5/5   5/5   Wrist extensors  5/5   5/5   Wrist flexors  5/5   5/5   Finger extensors  5/5   5/5   Finger flexors  5/5   5/5   Dorsal interossei  5/5   5/5   Abductor pollicis  5/5   5/5   Tone (Ashworth scale)  0  0   Lower Extremity:  Right  Left  Hip flexors  5/5   5/5   Knee flexors  5/5   5/5   Knee extensors  5/5   5/5   Dorsiflexors  5/5   5/5   Plantarflexors  5/5   5/5   Toe  extensors  5/5   5/5   Toe flexors  5/5   5/5   Tone (Ashworth scale)  0  0   MSRs:  Right        Left brachioradialis 2+  2+  biceps 2+  2+  triceps 2+  2+  patellar 3+  3+  ankle jerk 2+  2+  Hoffman no  no  plantar response down  down   SENSORY:  Normal and symmetric perception of light touch, pinprick, vibration, and temperature.  Romberg's sign absent. Tinel's sign is mildly positive at the left wrist.   COORDINATION/GAIT: Normal finger-to- nose-finger.  Intact rapid alternating movements bilaterally.  Able to rise from a chair without using arms.  Gait narrow based and stable. Tandem and stressed gait intact.     Thank you for allowing me to participate in patient's care.  If I can answer any additional questions, I would be pleased to do so.    Sincerely,    Rielynn Trulson K. Lydia Sams, DO

## 2023-09-29 ENCOUNTER — Ambulatory Visit: Admitting: Neurology

## 2023-09-29 ENCOUNTER — Other Ambulatory Visit

## 2023-09-29 ENCOUNTER — Encounter: Payer: Self-pay | Admitting: Neurology

## 2023-09-29 VITALS — BP 116/71 | HR 58 | Ht 64.0 in | Wt 127.0 lb

## 2023-09-29 DIAGNOSIS — R413 Other amnesia: Secondary | ICD-10-CM

## 2023-09-29 DIAGNOSIS — R202 Paresthesia of skin: Secondary | ICD-10-CM

## 2023-09-29 DIAGNOSIS — R2689 Other abnormalities of gait and mobility: Secondary | ICD-10-CM | POA: Diagnosis not present

## 2023-09-29 DIAGNOSIS — R292 Abnormal reflex: Secondary | ICD-10-CM

## 2023-09-29 LAB — B12 AND FOLATE PANEL
Folate: 6 ng/mL
Vitamin B-12: 342 pg/mL (ref 200–1100)

## 2023-09-30 ENCOUNTER — Ambulatory Visit: Payer: Self-pay | Admitting: Neurology

## 2023-10-05 ENCOUNTER — Ambulatory Visit
Admission: RE | Admit: 2023-10-05 | Discharge: 2023-10-05 | Disposition: A | Discharge status: Home / Self Care | Source: Ambulatory Visit | Attending: Neurology | Admitting: Neurology

## 2023-10-05 DIAGNOSIS — R292 Abnormal reflex: Secondary | ICD-10-CM

## 2023-10-05 DIAGNOSIS — R202 Paresthesia of skin: Secondary | ICD-10-CM

## 2023-10-05 DIAGNOSIS — R2689 Other abnormalities of gait and mobility: Secondary | ICD-10-CM

## 2023-10-05 DIAGNOSIS — R413 Other amnesia: Secondary | ICD-10-CM

## 2023-10-08 ENCOUNTER — Encounter: Payer: Self-pay | Admitting: Neurology

## 2023-10-09 ENCOUNTER — Other Ambulatory Visit: Payer: Self-pay

## 2023-10-09 DIAGNOSIS — M26621 Arthralgia of right temporomandibular joint: Secondary | ICD-10-CM

## 2023-10-13 ENCOUNTER — Encounter (INDEPENDENT_AMBULATORY_CARE_PROVIDER_SITE_OTHER): Payer: Self-pay

## 2023-11-05 ENCOUNTER — Ambulatory Visit (INDEPENDENT_AMBULATORY_CARE_PROVIDER_SITE_OTHER): Admitting: Neurology

## 2023-11-05 DIAGNOSIS — R2689 Other abnormalities of gait and mobility: Secondary | ICD-10-CM

## 2023-11-05 DIAGNOSIS — R413 Other amnesia: Secondary | ICD-10-CM

## 2023-11-05 DIAGNOSIS — R292 Abnormal reflex: Secondary | ICD-10-CM

## 2023-11-05 DIAGNOSIS — R202 Paresthesia of skin: Secondary | ICD-10-CM | POA: Diagnosis not present

## 2023-11-05 NOTE — Procedures (Signed)
 [facility]  [address] [place], [address]  [place], [place] [address] Tel: [phone] Fax: [phone] Test Date:  11/05/2023  Patient: Kaitlyn Floyd DOB: 1980-09-11 Physician: Tonita Blanch, DO  Sex: Female Height: 5' 4 Ref Phys: Tonita Blanch, DO  ID#: [id number]   Technician:    History: This is a 43 year old female referred for evaluation of bilateral upper extremity paresthesias.  NCV & EMG Findings: Extensive electrodiagnostic testing of the right upper extremity and additional studies of the left shows: Bilateral median, ulnar, and mixed palmar sensory responses are within normal limits. Bilateral median and ulnar motor responses are within normal limits. There is no evidence of active or chronic motor axonal loss changes affecting any of the tested muscles.  Motor unit configuration and recruitment pattern is within normal limits.  Impression: This is a normal study of the upper extremities.  In particular, there is no evidence of carpal tunnel syndrome or a cervical radiculopathy.   ___________________________ Tonita Blanch, DO    Nerve Conduction Studies   Stim Site NR Peak (ms) Norm Peak (ms) O-P Amp (V) Norm O-P Amp  Left Median Anti Sensory (2nd Digit)  32 C  Wrist    2.8 <3.4 64.8 >20  Right Median Anti Sensory (2nd Digit)  32 C  Wrist    2.9 <3.4 60.1 >20  Left Ulnar Anti Sensory (5th Digit)  32 C  Wrist    2.4 <3.1 37.1 >12  Right Ulnar Anti Sensory (5th Digit)  32 C  Wrist    2.3 <3.1 31.9 >12     Stim Site NR Onset (ms) Norm Onset (ms) O-P Amp (mV) Norm O-P Amp Site1 Site2 Delta-0 (ms) Dist (cm) Vel (m/s) Norm Vel (m/s)  Left Median Motor (Abd Poll Brev)  32 C  Wrist    2.9 <3.9 9.3 >6 Elbow Wrist 4.8 28.0 58 >50  Elbow    7.7  8.8         Right Median Motor (Abd Poll Brev)  32 C  Wrist    2.7 <3.9 10.8 >6 Elbow Wrist 5.0 29.0 58 >50  Elbow    7.7  10.4         Left Ulnar Motor (Abd Dig Minimi)  32 C  Wrist    2.2 <3.1 8.8 >7 B  Elbow Wrist 3.3 21.0 64 >50  B Elbow    5.5  8.4  A Elbow B Elbow 1.7 10.0 59 >50  A Elbow    7.2  8.2         Right Ulnar Motor (Abd Dig Minimi)  32 C  Wrist    2.3 <3.1 10.3 >7 B Elbow Wrist 3.6 22.0 61 >50  B Elbow    5.9  9.1  A Elbow B Elbow 1.5 10.0 67 >50  A Elbow    7.4  8.8            Stim Site NR Peak (ms) Norm Peak (ms) P-T Amp (V) Site1 Site2 Delta-P (ms) Norm Delta (ms)  Left Median/Ulnar Palm Comparison (Wrist - 8cm)  32 C  Median Palm    1.5 <2.2 117.9 Median Palm Ulnar Palm 0.2   Ulnar Palm    1.3 <2.2 20.9      Right Median/Ulnar Palm Comparison (Wrist - 8cm)  32 C  Median Palm    1.5 <2.2 139.1 Median Palm Ulnar Palm 0.0   Ulnar Palm    1.5 <2.2 15.5  Electromyography   Side Muscle Ins.Act Fibs Fasc Recrt Amp Dur Poly Activation Comment  Right 1stDorInt Nml Nml Nml Nml Nml Nml Nml Nml N/A  Right PronatorTeres Nml Nml Nml Nml Nml Nml Nml Nml N/A  Right Biceps Nml Nml Nml Nml Nml Nml Nml Nml N/A  Right Triceps Nml Nml Nml Nml Nml Nml Nml Nml N/A  Right Deltoid Nml Nml Nml Nml Nml Nml Nml Nml N/A  Left 1stDorInt Nml Nml Nml Nml Nml Nml Nml Nml N/A  Left PronatorTeres Nml Nml Nml Nml Nml Nml Nml Nml N/A  Left Biceps Nml Nml Nml Nml Nml Nml Nml Nml N/A  Left Triceps Nml Nml Nml Nml Nml Nml Nml Nml N/A  Left Deltoid Nml Nml Nml Nml Nml Nml Nml Nml N/A      Waveforms:

## 2023-11-19 ENCOUNTER — Telehealth: Payer: Self-pay

## 2023-11-19 ENCOUNTER — Other Ambulatory Visit: Payer: Self-pay

## 2023-11-19 DIAGNOSIS — S0300XA Dislocation of jaw, unspecified side, initial encounter: Secondary | ICD-10-CM

## 2023-11-19 DIAGNOSIS — M26621 Arthralgia of right temporomandibular joint: Secondary | ICD-10-CM

## 2023-11-19 NOTE — Telephone Encounter (Signed)
 Referral created and faxed to [facility] on [facility].

## 2023-11-19 NOTE — Telephone Encounter (Signed)
-----   Message from Tonita MARLA Blanch sent at 11/19/2023 10:37 AM EDT ----- It will have to be external referral to oral surgery or dentist (not sure what options are in epic).  We can refer to [facility]. Thanks. ----- Message ----- From: Dasie Brannon LABOR, CMA Sent: 11/19/2023   8:38 AM EDT To: Donika K Patel, DO  Dr. Blanch- What location should we send her to? There is no selections for internal.

## 2023-12-16 ENCOUNTER — Encounter: Payer: Self-pay | Admitting: Neurology

## 2023-12-16 NOTE — Telephone Encounter (Signed)
 [facility] [address], [place], [place] [address]  3.4 mi [phone]  [facility] and they have not received patients referral. Bari from the [facility] informed me that they do not treat TMJ.

## 2023-12-16 NOTE — Telephone Encounter (Signed)
 Re-faxed referral to [facility].

## 2023-12-16 NOTE — Telephone Encounter (Signed)
 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

## 2023-12-22 ENCOUNTER — Encounter: Payer: Self-pay | Admitting: Neurology

## 2024-01-04 ENCOUNTER — Ambulatory Visit: Admitting: Podiatry

## 2024-01-26 ENCOUNTER — Encounter: Payer: Self-pay | Admitting: Neurology

## 2024-01-26 NOTE — Telephone Encounter (Signed)
 Appt scheduled for tomorrow.

## 2024-01-27 ENCOUNTER — Encounter: Payer: Self-pay | Admitting: Neurology

## 2024-01-27 ENCOUNTER — Ambulatory Visit: Admitting: Neurology

## 2024-01-27 VITALS — BP 130/81 | HR 67 | Ht 64.0 in | Wt 128.0 lb

## 2024-01-27 DIAGNOSIS — R292 Abnormal reflex: Secondary | ICD-10-CM | POA: Diagnosis not present

## 2024-01-27 DIAGNOSIS — R519 Headache, unspecified: Secondary | ICD-10-CM

## 2024-01-27 NOTE — Patient Instructions (Signed)
 CTA head and neck MRI cervical spine wwo contrast

## 2024-01-27 NOTE — Progress Notes (Signed)
 Follow-up Visit   Date: 01/27/2024    Kaitlyn Floyd MRN: [id number] DOB: 15-May-1980    Kaitlyn Floyd is a 43 y.o. right-handed Caucasian female with anxiety/depression returning to the clinic for acute visit due to headache.  The patient was accompanied to the clinic by husband who also provides collateral information.    IMPRESSION/PLAN: New onset headache triggered by gag reflex causing severe shooting pain into the back of the head.  Negative Lhermitte's sign on exam.  Reflexes are symmetric and brisk, otherwise exam is normal.  To further evaluate her symptoms:  - CTA head and neck to evaluate for aneurysm  - MRI cervical spine wwo contrast spine pathology  Further recommendations pending results.   --------------------------------------------- History of present illness: Starting around 2024, she began having vibration sensation, numbness/tingling involving the feet, which occurs 1-2 times per week and lasts a few minutes.     She also experiences numbness/tingling of the hands, following by shooting pain.  She is dropping things and has difficulty opening bottles/jars.  She has some neck soreness.  She has history of Raynaud's.     She has imbalance and feel unsteady.  She has not had any falls.  She also had word-finding difficulty and memory concerns.  She is able to carry out her IADLs.       She does not smoke or drink alcohol. TSH is normal.  She works as Charity fundraiser at [employer]    UPDATE 01/27/2024: She reports having two spells where she developed severe shooting pain up the back of her neck and into her head, which lasted 5-10 minutes.  Pain was very intense and she was unable to move or get comfortable when this occurred.  She has a very sensitive gag reflex and she reports this triggered both spells.  After 10 minutes, her pain diminished, but she continues to have soreness over the neck and shoulders.  No associated weakness.  Medications:  Current Outpatient Medications  on File Prior to Visit  Medication Sig Dispense Refill   ALPRAZolam (XANAX) 0.25 MG tablet Take 0.25 mg by mouth 3 (three) times daily.     doxycycline  (VIBRA -TABS) 100 MG tablet Take 1 tablet (100 mg total) by mouth 2 (two) times daily. 14 tablet 0   TRINTELLIX 20 MG TABS tablet Take 20 mg by mouth daily.     estradiol (ESTRACE) 1 MG tablet Take 1 mg by mouth daily. (Patient not taking: Reported on 01/27/2024)     HYDROcodone -acetaminophen  (NORCO) 5-325 MG tablet Take 1-2 tablets by mouth every 6 (six) hours as needed for moderate pain (pain score 4-6). (Patient not taking: Reported on 01/27/2024) 15 tablet 0   predniSONE  (STERAPRED UNI-PAK 21 TAB) 10 MG (21) TBPK tablet Take following package directions (Patient not taking: Reported on 01/27/2024) 21 tablet 0   Sertraline HCl 200 MG CAPS Take 200 mg by mouth daily. (Patient not taking: Reported on 01/27/2024)     No current facility-administered medications on file prior to visit.    Allergies: No Known Allergies  Vital Signs:  BP 130/81   Pulse 67   Ht 5' 4 (1.626 m)   Wt 128 lb (58.1 kg)   SpO2 98%   BMI 21.97 kg/m     Neurological Exam: MENTAL STATUS including orientation to time, place, person, recent and remote memory, attention span and concentration, language, and fund of knowledge is normal.  Speech is not dysarthric.  CRANIAL NERVES:  No visual field defects.  Pupils equal round and reactive to light.  Normal conjugate, extra-ocular eye movements in all directions of gaze.  No ptosis.  Face is symmetric. Palate elevates symmetrically.  Tongue is midline.  MOTOR:  Motor strength is 5/5 in all extremities.  No atrophy, fasciculations or abnormal movements.  No pronator drift.  Tone is normal.    MSRs:  Reflexes are 2+/4 throughout, except 3+/4 bilateral patella.  SENSORY:  Intact to vibration and temperature throughout.  COORDINATION/GAIT:  Normal finger-to- nose-finger.  Intact rapid alternating movements bilaterally.  Gait  narrow based and stable.  Stressed and tandem gait intact.   Data: MRI brain 10/06/2023: 1. Unremarkable non-contrast MRI appearance of the brain. No evidence of an acute intracranial abnormality. 2. Fluid within bilateral mastoid air cells (small-volume right, trace left). 3. Small right temporomandibular joint synovial cyst.   NCS/EMG of the legs 11/05/2023: This is a normal study of the upper extremities.  In particular, there is no evidence of carpal tunnel syndrome or a cervical radiculopathy.   Lab Results  Component Value Date   VITAMINB12 342 09/29/2023      Thank you for allowing me to participate in patient's care.  If I can answer any additional questions, I would be pleased to do so.    Sincerely,    Royden Bulman K. Tobie, DO

## 2024-02-04 ENCOUNTER — Encounter: Payer: Self-pay | Admitting: Neurology

## 2024-02-04 ENCOUNTER — Ambulatory Visit
Admission: RE | Admit: 2024-02-04 | Discharge: 2024-02-04 | Disposition: A | Discharge status: Home / Self Care | Source: Ambulatory Visit | Attending: Neurology | Admitting: Neurology

## 2024-02-04 DIAGNOSIS — R292 Abnormal reflex: Secondary | ICD-10-CM

## 2024-02-04 DIAGNOSIS — G4453 Primary thunderclap headache: Secondary | ICD-10-CM

## 2024-02-04 MED ORDER — IOPAMIDOL (ISOVUE-370) INJECTION 76%
80.0000 mL | Freq: Once | INTRAVENOUS | Status: AC | PRN
  Administered 2024-02-04: 80 mL via INTRAVENOUS

## 2024-02-05 ENCOUNTER — Other Ambulatory Visit: Payer: Self-pay

## 2024-02-05 ENCOUNTER — Telehealth: Payer: Self-pay | Admitting: Neurology

## 2024-02-05 DIAGNOSIS — G4453 Primary thunderclap headache: Secondary | ICD-10-CM

## 2024-02-05 MED ORDER — VERAPAMIL HCL ER 120 MG PO CP24: 120.0000 mg | ORAL_CAPSULE | Freq: Every day | ORAL | 3 refills | Status: DC

## 2024-02-05 NOTE — Telephone Encounter (Signed)
 Called and spoke to patient about her headaches.  She continues to have severe headache triggered by gag/cough.  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  Although symptoms only involve one vessel, with her history of severe headache and vessel irregularity, RCVS is considered.  I will plan to get MRI brain wwo contrast and MRV brain.   For pain, trial of verapamil 120mg  at bedtime will be started.  This can be titrated as needed.  All questions answered.

## 2024-02-05 NOTE — Telephone Encounter (Signed)
 Pt is returning a call to the office

## 2024-02-05 NOTE — Telephone Encounter (Signed)
 I left message to call office back if any questions were still needed.

## 2024-02-12 ENCOUNTER — Encounter: Payer: Self-pay | Admitting: Neurology

## 2024-02-12 MED ORDER — PROCHLORPERAZINE MALEATE 5 MG PO TABS: 5.0000 mg | ORAL_TABLET | Freq: Four times a day (QID) | ORAL | 1 refills | Status: AC | PRN

## 2024-02-21 ENCOUNTER — Other Ambulatory Visit

## 2024-02-22 ENCOUNTER — Ambulatory Visit
Admission: RE | Admit: 2024-02-22 | Discharge: 2024-02-22 | Disposition: A | Source: Ambulatory Visit | Attending: Neurology

## 2024-02-22 ENCOUNTER — Ambulatory Visit
Admission: RE | Admit: 2024-02-22 | Discharge: 2024-02-22 | Disposition: A | Discharge status: Home / Self Care | Source: Ambulatory Visit | Attending: Neurology | Admitting: Neurology

## 2024-02-22 ENCOUNTER — Other Ambulatory Visit: Payer: Self-pay | Admitting: Neurology

## 2024-02-22 DIAGNOSIS — R292 Abnormal reflex: Secondary | ICD-10-CM

## 2024-02-22 DIAGNOSIS — G4453 Primary thunderclap headache: Secondary | ICD-10-CM

## 2024-02-22 MED ORDER — GADOPICLENOL 0.5 MMOL/ML IV SOLN
7.5000 mL | Freq: Once | INTRAVENOUS | Status: AC | PRN
  Administered 2024-02-22: 6 mL via INTRAVENOUS

## 2024-02-23 ENCOUNTER — Ambulatory Visit: Payer: Self-pay | Admitting: Neurology

## 2024-04-07 ENCOUNTER — Other Ambulatory Visit (HOSPITAL_BASED_OUTPATIENT_CLINIC_OR_DEPARTMENT_OTHER): Payer: Self-pay | Admitting: Family Medicine

## 2024-04-07 ENCOUNTER — Ambulatory Visit (INDEPENDENT_AMBULATORY_CARE_PROVIDER_SITE_OTHER)
Admission: RE | Admit: 2024-04-07 | Discharge: 2024-04-07 | Disposition: A | Discharge status: Home / Self Care | Source: Ambulatory Visit | Attending: Family Medicine | Admitting: Family Medicine

## 2024-04-07 DIAGNOSIS — R0781 Pleurodynia: Secondary | ICD-10-CM | POA: Diagnosis not present

## 2024-04-07 DIAGNOSIS — J4521 Mild intermittent asthma with (acute) exacerbation: Secondary | ICD-10-CM | POA: Diagnosis not present

## 2024-05-06 ENCOUNTER — Other Ambulatory Visit: Payer: Self-pay | Admitting: Neurology
